# Patient Record
Sex: Male | Born: 1995
Health system: Southern US, Community
[De-identification: ages and names within clinical notes are randomized; demographics above are authoritative.]

## PROBLEM LIST (undated history)

## (undated) DIAGNOSIS — I8002 Phlebitis and thrombophlebitis of superficial vessels of left lower extremity: Principal | ICD-10-CM

## (undated) DIAGNOSIS — Z832 Family history of diseases of the blood and blood-forming organs and certain disorders involving the immune mechanism: Secondary | ICD-10-CM

## (undated) DIAGNOSIS — R2242 Localized swelling, mass and lump, left lower limb: Secondary | ICD-10-CM

## (undated) HISTORY — PX: FOOT SURGERY: SHX648

## (undated) HISTORY — DX: Family history of diseases of the blood and blood-forming organs and certain disorders involving the immune mechanism: Z83.2

## (undated) HISTORY — DX: Localized swelling, mass and lump, left lower limb: R22.42

## (undated) HISTORY — PX: GANGLION CYST EXCISION: SHX1691

## (undated) HISTORY — PX: TONSILLECTOMY: SUR1361

## (undated) HISTORY — DX: Phlebitis and thrombophlebitis of superficial vessels of left lower extremity: I80.02

---

## 2018-03-18 ENCOUNTER — Encounter (HOSPITAL_BASED_OUTPATIENT_CLINIC_OR_DEPARTMENT_OTHER): Payer: Self-pay

## 2018-03-18 ENCOUNTER — Other Ambulatory Visit: Payer: Self-pay

## 2018-03-18 ENCOUNTER — Emergency Department (HOSPITAL_BASED_OUTPATIENT_CLINIC_OR_DEPARTMENT_OTHER): Payer: Managed Care, Other (non HMO)

## 2018-03-18 ENCOUNTER — Emergency Department (HOSPITAL_BASED_OUTPATIENT_CLINIC_OR_DEPARTMENT_OTHER)
Admission: EM | Admit: 2018-03-18 | Discharge: 2018-03-19 | Disposition: A | Discharge status: Home / Self Care | Payer: Managed Care, Other (non HMO) | Attending: Emergency Medicine | Admitting: Emergency Medicine

## 2018-03-18 DIAGNOSIS — I8002 Phlebitis and thrombophlebitis of superficial vessels of left lower extremity: Secondary | ICD-10-CM | POA: Insufficient documentation

## 2018-03-18 DIAGNOSIS — R2242 Localized swelling, mass and lump, left lower limb: Secondary | ICD-10-CM | POA: Diagnosis present

## 2018-03-18 MED ORDER — IBUPROFEN 600 MG PO TABS: 600.0000 mg | ORAL_TABLET | Freq: Four times a day (QID) | ORAL | 0 refills | Status: DC | PRN

## 2018-03-18 NOTE — Discharge Instructions (Signed)
Please see the information and instructions below regarding your visit.  Your diagnoses today include:  1. Thrombophlebitis of superficial veins of left lower extremity     Tests performed today include: See side panel of your discharge paperwork for testing performed today. Vital signs are listed at the bottom of these instructions.   Your ultrasound is showing a condition called superficial thrombophlebitis.  You will need a repeat ultrasound in 1 week.  The ultrasound is available from 8 AM to 12:30 AM next Wednesday.  Medications prescribed:    Take any prescribed medications only as prescribed, and any over the counter medications only as directed on the packaging.  You are prescribed ibuprofen, a non-steroidal anti-inflammatory agent (NSAID) for pain. You may take 600 mg every 6  hours as needed for pain. If still requiring this medication around the clock for acute pain after 10 days, please see your primary healthcare provider.  Women who are pregnant, breastfeeding, or planning on becoming pregnant should not take non-steroidal anti-inflammatories such as Advil and Aleve. Tylenol is a safe over the counter pain reliever in pregnant women.  You may combine this medication with Tylenol, 650 mg every 6 hours, so you are receiving something for pain every 3 hours.  This is not a long-term medication unless under the care and direction of your primary provider. Taking this medication long-term and not under the supervision of a healthcare provider could increase the risk of stomach ulcers, kidney problems, and cardiovascular problems such as high blood pressure.   Home care instructions:  Please follow any educational materials contained in this packet.   Follow-up instructions: Please follow-up with this department in 1 week.  You will need a repeat ultrasound.  Return instructions:  Please return to the Emergency Department if you experience worsening symptoms. Please return the  emergency department if you develop any worsening swelling, redness of the leg, chest pain, shortness of breath. Please return if you have any other emergent concerns.  Additional Information:   Your vital signs today were: BP 137/88    Pulse (!) 59    Temp 98.5 F (36.9 C) (Oral)    Resp 18    Ht 5\' 8"  (1.727 m)    Wt 106 kg    SpO2 96%    BMI 35.53 kg/m  If your blood pressure (BP) was elevated on multiple readings during this visit above 130 for the top number or above 80 for the bottom number, please have this repeated by your primary care provider within one month. --------------  Thank you for allowing [place] to participate in your care today.

## 2018-03-18 NOTE — ED Triage Notes (Signed)
C/o swelling, pain to left LE x 1 week-denies injury-pt states he wears "high ankle boots at work"-sent from UC for possible DVT-pt NAD-steady gait

## 2018-03-18 NOTE — ED Notes (Signed)
ED Provider at bedside. 

## 2018-03-18 NOTE — ED Provider Notes (Signed)
[facility] Provider Note   CSN: [id number] Arrival date & time: 03/18/18  2006    History   Chief Complaint Chief Complaint  Patient presents with  . Leg Swelling    HPI Christopher Grimes is a 23 y.o. male.     HPI  Patient is a 23 year old male with no significant past medical history presenting for pain and swelling of his left lower extremity.  Reports is been progressive over 1 week.  He thought that it could have been due to wearing tight boots for work rubbing on his calf.  He reports that he presented from urgent care today and he was instructed to come to the emergency department for DVT rule out.  Patient denies any loss of sensation.  He denies any fever or chills, or erythema streaking up the leg.  He does not acknowledge a small patch of erythema in the proximal medial left calf.  He denies any history of DVT/PE, recent mobilization, hospitalization, hormone use, cancer treatment, recent surgery.  Patient does report that his father has a history of possibly unprovoked DVT.  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  He denies any chest pain or shortness of breath.  History reviewed. No pertinent past medical history.  There are no active problems to display for this patient.   Past Surgical History:  Procedure Laterality Date  . FOOT SURGERY    . GANGLION CYST EXCISION    . TONSILLECTOMY          Home Medications    Prior to Admission medications   Not on File    Family History No family history on file.  Social History Social History   Tobacco Use  . Smoking status: Never Smoker  . Smokeless tobacco: Never Used  Substance Use Topics  . Alcohol use: Never    Frequency: Never  . Drug use: Never     Allergies   Patient has no known allergies.   Review of Systems Review of Systems  Constitutional: Negative for chills and fever.  Respiratory: Negative for  shortness of breath.   Cardiovascular: Positive for leg swelling. Negative for chest pain.  Musculoskeletal: Positive for myalgias. Negative for arthralgias.  Skin: Positive for color change.  Allergic/Immunologic: Negative for immunocompromised state.  Neurological: Negative for weakness and numbness.  All other systems reviewed and are negative.    Physical Exam Updated Vital Signs BP (!) 144/93 (BP Location: Right Arm)   Pulse 81   Temp 98.5 F (36.9 C) (Oral)   Resp 16   Ht 5\' 8"  (1.727 m)   Wt 106 kg   SpO2 98%   BMI 35.53 kg/m   Physical Exam Vitals signs and nursing note reviewed.  Constitutional:      General: He is not in acute distress.    Appearance: He is well-developed.  HENT:     Head: Normocephalic and atraumatic.  Eyes:     Conjunctiva/sclera: Conjunctivae normal.     Pupils: Pupils are equal, round, and reactive to light.  Neck:     Musculoskeletal: Normal range of motion and neck supple.  Cardiovascular:     Rate and Rhythm: Normal rate and regular rhythm.     Heart sounds: S1 normal and S2 normal. No murmur.  Pulmonary:     Effort: Pulmonary effort is normal.     Breath sounds: Normal breath sounds. No wheezing or rales.  Abdominal:  General: There is no distension.     Palpations: Abdomen is soft.     Tenderness: There is no abdominal tenderness. There is no guarding.  Musculoskeletal: Normal range of motion.     Comments: Patient has some minor swelling of the mid calf of the left lower extremity.  No tenderness to palpation of the calf.  He does have an indurated area overlying the left medial calf.  Small amount of erythema overlying. He has 2+ DP pulse.  All compartments are soft.  Lymphadenopathy:     Cervical: No cervical adenopathy.  Skin:    General: Skin is warm and dry.     Findings: No erythema or rash.  Neurological:     Mental Status: He is alert.     Comments: Cranial nerves grossly intact. Patient moves extremities  symmetrically and with good coordination.  Psychiatric:        Behavior: Behavior normal.        Thought Content: Thought content normal.        Judgment: Judgment normal.      ED Treatments / Results  Labs (all labs ordered are listed, but only abnormal results are displayed) Labs Reviewed - No data to display  EKG None  Radiology US Venous Img Lower Unilateral Left  Result Date: 03/18/2018 CLINICAL DATA:  Left lower extremity (calf) swelling and erythema x1 week. EXAM: LEFT LOWER EXTREMITY VENOUS DOPPLER ULTRASOUND TECHNIQUE: Gray-scale sonography with graded compression, as well as color Doppler and duplex ultrasound were performed to evaluate the lower extremity deep venous systems from the level of the common femoral vein and including the common femoral, femoral, profunda femoral, popliteal and calf veins including the posterior tibial, peroneal and gastrocnemius veins when visible. The superficial great saphenous vein was also interrogated. Spectral Doppler was utilized to evaluate flow at rest and with distal augmentation maneuvers in the common femoral, femoral and popliteal veins. COMPARISON:  None. FINDINGS: Contralateral Common Femoral Vein: Respiratory phasicity is normal and symmetric with the symptomatic side. No evidence of thrombus. Normal compressibility. Common Femoral Vein: No evidence of thrombus. Normal compressibility, respiratory phasicity and response to augmentation. Saphenofemoral Junction: No evidence of thrombus. Normal compressibility and flow on color Doppler imaging. Profunda Femoral Vein: No evidence of thrombus. Normal compressibility and flow on color Doppler imaging. Femoral Vein: No evidence of thrombus. Normal compressibility, respiratory phasicity and response to augmentation. Popliteal Vein: No evidence of thrombus. Normal compressibility, respiratory phasicity and response to augmentation. Calf Veins: No evidence of thrombus. Normal compressibility and  flow on color Doppler imaging. Superficial Great Saphenous Vein: Thrombophlebitis of the great saphenous vein from ankle to upper calf. Venous Reflux:  None. Other Findings:  None. IMPRESSION: No evidence of deep venous thrombosis. Great saphenous vein thrombophlebitis from the level of the ankle to upper calf. These can propagate and developed into the venous thrombosis and therefore close interval follow-up or if clinically necessary, anticoagulation should be considered. Electronically Signed   By: Ashley Royalty M.D.   On: 03/18/2018 22:29    Procedures Procedures (including critical care time)  Medications Ordered in ED Medications - No data to display   Initial Impression / Assessment and Plan / ED Course  I have reviewed the triage vital signs and the nursing notes.  Pertinent labs & imaging results that were available during my care of the patient were reviewed by me and considered in my medical decision making (see chart for details).        Patient is  well-appearing and neurovascularly intact in the left lower extremity.  Patient with pain, swelling, and minor induration overlying the left medial calf.  Patient received duplex ultrasound which shows thrombophlebitis of the great saphenous vein from the level of ankle to upper calf.  No evidence of DVT.  Findings discussed with attending physician.  Patient has no other risk factors for thrombosis, but that is will need close follow-up.  Emphasized to the patient that he will need a repeat ultrasound in 1 week, and instructed him to come to the emergency department.  Do not feel that anticoagulation is warranted at this time.  Patient instructed to take NSAIDs and perform warm compresses to assist with management of this.  Return precautions given for any increase in swelling, erythema, chest pain or shortness of breath.  Patient is in understanding and agrees with the plan of care.  Final Clinical Impressions(s) / ED Diagnoses   Final  diagnoses:  Thrombophlebitis of superficial veins of left lower extremity    ED Discharge Orders         Ordered    ibuprofen (ADVIL,MOTRIN) 600 MG tablet  Every 6 hours PRN     03/18/18 2358           Tamala Julian 03/19/18 0123    Jola Schmidt, MD 03/19/18 [phone]

## 2018-03-19 ENCOUNTER — Telehealth: Payer: Self-pay

## 2018-03-19 NOTE — Telephone Encounter (Signed)
Pt had [place] at [facility] on 03/18/2018.  Scheduled a follow up US on 03/27/2018 here.  Pt notified.  Will check in through UC and follow up with UC after [place].

## 2018-03-27 ENCOUNTER — Encounter (INDEPENDENT_AMBULATORY_CARE_PROVIDER_SITE_OTHER): Payer: Self-pay

## 2018-03-27 ENCOUNTER — Emergency Department (INDEPENDENT_AMBULATORY_CARE_PROVIDER_SITE_OTHER)
Admission: EM | Admit: 2018-03-27 | Discharge: 2018-03-27 | Disposition: A | Discharge status: Home / Self Care | Payer: Managed Care, Other (non HMO) | Source: Home / Self Care | Attending: Family Medicine | Admitting: Family Medicine

## 2018-03-27 ENCOUNTER — Other Ambulatory Visit: Payer: Self-pay

## 2018-03-27 ENCOUNTER — Ambulatory Visit: Payer: Managed Care, Other (non HMO)

## 2018-03-27 DIAGNOSIS — I8002 Phlebitis and thrombophlebitis of superficial vessels of left lower extremity: Secondary | ICD-10-CM | POA: Diagnosis not present

## 2018-03-27 NOTE — ED Provider Notes (Signed)
[name]    CSN: [id number] Arrival date & time: 03/27/18  1458     History   Chief Complaint Chief Complaint  Patient presents with  . Follow-up    LT leg [place]    HPI [name] is a 23 y.o. male.   Patient presents for a repeat venous ultrasound of his left lower leg and follow-up of superficial thrombophlebitis. Nine days ago he presented to the [facility] ED for pain/swelling in his left lower extremity.  Venous ultrasound revealed superficial thrombophlebitis of greater saphenous vein from the level of the ankle to the upper calf.  He was advised to take NSAID's and apply warm compresses, and follow-up in one week for repeat venous US. Patient reports decreased leg pain and swelling.  He denies chest pain or shortness of breath. 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  He believes that his father may have had an unprovoked DVT.  The history is provided by the patient.    History reviewed. No pertinent past medical history.  There are no active problems to display for this patient.   Past Surgical History:  Procedure Laterality Date  . FOOT SURGERY    . GANGLION CYST EXCISION    . TONSILLECTOMY         Home Medications    Prior to Admission medications   Medication Sig Start Date End Date Taking? Authorizing Provider  ibuprofen (ADVIL,MOTRIN) 600 MG tablet Take 1 tablet (600 mg total) by mouth every 6 (six) hours as needed. 03/18/18   Albesa Seen, PA-C    Family History History reviewed. No pertinent family history.  Social History Social History   Tobacco Use  . Smoking status: Never Smoker  . Smokeless tobacco: Never Used  Substance Use Topics  . Alcohol use: Never    Frequency: Never  . Drug use: Never     Allergies   Patient has no known allergies.   Review of Systems Review of Systems  Constitutional: Negative for chills, diaphoresis, fatigue and fever.  HENT: Negative.   Eyes: Negative.   Respiratory:  Negative for cough, chest tightness, shortness of breath, wheezing and stridor.   Cardiovascular: Negative for chest pain, palpitations and leg swelling.  Gastrointestinal: Negative.   Genitourinary: Negative.   Musculoskeletal: Negative.   Skin: Negative.   Neurological: Negative for headaches.     Physical Exam Triage Vital Signs ED Triage Vitals  Enc Vitals Group     BP 03/27/18 1548 135/85     Pulse Rate 03/27/18 1548 64     Resp 03/27/18 1548 18     Temp --      Temp src --      SpO2 03/27/18 1548 96 %     Weight 03/27/18 1550 233 lb 11 oz (106 kg)     Height 03/27/18 1550 5\' 8"  (1.727 m)     Head Circumference --      Peak Flow --      Pain Score 03/27/18 1550 1     Pain Loc --      Pain Edu? --      Excl. in [place]? --    No data found.  Updated Vital Signs BP 135/85 (BP Location: Right Arm)   Pulse 64   Resp 18   Ht 5\' 8"  (1.727 m)   Wt 106 kg   SpO2 96%   BMI 35.53 kg/m   Visual Acuity Right Eye Distance:   Left Eye Distance:  Bilateral Distance:    Right Eye Near:   Left Eye Near:    Bilateral Near:     Physical Exam Vitals signs and nursing note reviewed.  Constitutional:      General: He is not in acute distress. HENT:     Head: Normocephalic.     Nose: Nose normal.     Mouth/Throat:     Mouth: Mucous membranes are moist.  Eyes:     Pupils: Pupils are equal, round, and reactive to light.  Cardiovascular:     Heart sounds: Normal heart sounds.  Pulmonary:     Breath sounds: Normal breath sounds. No wheezing, rhonchi or rales.  Abdominal:     Tenderness: There is no abdominal tenderness.  Musculoskeletal:     Right lower leg: No edema.     Left lower leg: He exhibits no swelling. No edema.       Legs:     Comments: Left lower leg has minimal tenderness anteriorly as noted on diagram.  No erythema or warmth.  No calf tenderness.  No tenderness above the knee.  Lymphadenopathy:     Cervical: No cervical adenopathy.  Skin:    General:  Skin is warm and dry.  Neurological:     Mental Status: He is alert and oriented to person, place, and time.      UC Treatments / Results  Labs (all labs ordered are listed, but only abnormal results are displayed) Labs Reviewed - No data to display  EKG None  Radiology US Venous Img Lower Unilateral Left  Result Date: 03/27/2018 CLINICAL DATA:  Follow-up of recent thrombophlebitis of the left great saphenous vein. EXAM: LEFT LOWER EXTREMITY VENOUS DOPPLER ULTRASOUND TECHNIQUE: Gray-scale sonography with graded compression, as well as color Doppler and duplex ultrasound were performed to evaluate the lower extremity deep venous systems from the level of the common femoral vein and including the common femoral, femoral, profunda femoral, popliteal and calf veins including the posterior tibial, peroneal and gastrocnemius veins when visible. The superficial great saphenous vein was also interrogated. Spectral Doppler was utilized to evaluate flow at rest and with distal augmentation maneuvers in the common femoral, femoral and popliteal veins. COMPARISON:  03/18/2018 FINDINGS: Contralateral Common Femoral Vein: Respiratory phasicity is normal and symmetric with the symptomatic side. No evidence of thrombus. Normal compressibility. Common Femoral Vein: No evidence of thrombus. Normal compressibility, respiratory phasicity and response to augmentation. Saphenofemoral Junction: No evidence of thrombus. Normal compressibility and flow on color Doppler imaging. Profunda Femoral Vein: No evidence of thrombus. Normal compressibility and flow on color Doppler imaging. Femoral Vein: No evidence of thrombus. Normal compressibility, respiratory phasicity and response to augmentation. Popliteal Vein: No evidence of thrombus. Normal compressibility, respiratory phasicity and response to augmentation. Calf Veins: No evidence of thrombus. Normal compressibility and flow on color Doppler imaging. Superficial Great  Saphenous Vein: There remains evidence of superficial thrombophlebitis in the left great saphenous vein extending from just above the knee to the ankle. Wall there may be slight propagation of GSV thrombus to just above the knee, there certainly is no evidence of thrombus further in the thigh or at the saphenofemoral junction. Venous Reflux:  None. Other Findings:  No abnormal fluid collections. IMPRESSION: Persistent left GSV superficial thrombophlebitis. It is likely too early to detect any significant resolution. While there may be slightly higher thrombus in the GSV to just above the knee, there clearly is no evidence of deep venous thrombosis. Electronically Signed   By: Eulas Post  Kathlene Cote M.D.   On: 03/27/2018 15:03    Procedures Procedures (including critical care time)  Medications Ordered in UC Medications - No data to display  Initial Impression / Assessment and Plan / UC Course  I have reviewed the triage vital signs and the nursing notes.  Pertinent labs & imaging results that were available during my care of the patient were reviewed by me and considered in my medical decision making (see chart for details).    Superficial thrombophlebitis of left greater saphenous vein does not appear to have progressed.   Recommend that he follow-up with PCP in about two weeks. Patient may have factor V Leiden deficiency (he believes that his father had a blood clotting disorder).   Final Clinical Impressions(s) / UC Diagnoses   Final diagnoses:  Thrombophlebitis of superficial veins of left lower extremity     Discharge Instructions     Continue Ibuprofen 200mg , 4 tabs every 8 hours with food.  Elevate legs and apply heating pad whenever possible. Recommend obtaining graduated compression stockings to wear daytime.  If symptoms become significantly worse during the night or over the weekend, proceed to the local emergency room.     ED Prescriptions    None        Kandra Nicolas, MD 04/07/18 [phone]

## 2018-03-27 NOTE — ED Triage Notes (Signed)
Pt here today for follow up  Of LT leg pain and swelling. Had [place] at [place] on 2/19/ Had repeat US and here for results.

## 2018-03-27 NOTE — Discharge Instructions (Addendum)
Continue Ibuprofen 200mg , 4 tabs every 8 hours with food.  Elevate legs and apply heating pad whenever possible. Recommend obtaining graduated compression stockings to wear daytime.  If symptoms become significantly worse during the night or over the weekend, proceed to the local emergency room.

## 2018-04-02 ENCOUNTER — Other Ambulatory Visit: Payer: Self-pay

## 2018-04-02 ENCOUNTER — Emergency Department: Payer: Managed Care, Other (non HMO)

## 2018-04-02 ENCOUNTER — Emergency Department (INDEPENDENT_AMBULATORY_CARE_PROVIDER_SITE_OTHER)
Admission: EM | Admit: 2018-04-02 | Discharge: 2018-04-02 | Disposition: A | Discharge status: Home / Self Care | Payer: Managed Care, Other (non HMO) | Source: Home / Self Care | Attending: Emergency Medicine | Admitting: Emergency Medicine

## 2018-04-02 ENCOUNTER — Encounter: Payer: Self-pay | Admitting: Emergency Medicine

## 2018-04-02 DIAGNOSIS — I82409 Acute embolism and thrombosis of unspecified deep veins of unspecified lower extremity: Secondary | ICD-10-CM

## 2018-04-02 DIAGNOSIS — I8002 Phlebitis and thrombophlebitis of superficial vessels of left lower extremity: Secondary | ICD-10-CM

## 2018-04-02 MED ORDER — AMOXICILLIN-POT CLAVULANATE 875-125 MG PO TABS: 1.0000 | ORAL_TABLET | Freq: Two times a day (BID) | ORAL | 0 refills | Status: DC

## 2018-04-02 MED ORDER — RIVAROXABAN (XARELTO) VTE STARTER PACK (15 & 20 MG): ORAL_TABLET | ORAL | 0 refills | Status: DC

## 2018-04-02 NOTE — Discharge Instructions (Addendum)
Take antibiotics as instructed. Continue to wear support hose. Please review with the pharmacist the risks of taking a blood thinner. Please keep your follow-up appointment to have testing done for predisposition to clots. Stop your ibuprofen since you are on Xarelto If you develop chest pain or shortness of breath please go immediately to the emergency room.

## 2018-04-02 NOTE — ED Triage Notes (Signed)
Left leg pain, thrombophebitis, getting worse and moving up above his knee.

## 2018-04-02 NOTE — ED Provider Notes (Signed)
[facility]    CSN: [id number] Arrival date & time: 04/02/18  0820     History   Chief Complaint Chief Complaint  Patient presents with  . Leg Pain    HPI Christopher Grimes is a 23 y.o. male.   HPI Patient enters for follow-up of increasing discomfort in his left leg.  Patient was seen initially 03/18/2018 in the emergency room.  He had ultrasound done at that time which showed thrombophlebitis involving the great saphenous vein from the ankle to the upper calf of his left leg.  He was started on ibuprofen and seen in follow-up on 03/27/2018.  Ultrasound at that time showed progression of the clot to just above the knee but no evidence of DVT.  At that time he was changed to ibuprofen 803 times a day.  He has been wearing a support stocking on that leg.  At the end of the day he has significant redness involving that vein.  The area has become tender where previously it was had minimal tenderness.  He has also noted a knot-like area present over the lower medial quadriceps area.  Of note his father is a Leiden factor V mutation positive as is one brother.  He has never had testing. History reviewed. No pertinent past medical history.  There are no active problems to display for this patient.   Past Surgical History:  Procedure Laterality Date  . FOOT SURGERY    . GANGLION CYST EXCISION    . TONSILLECTOMY         Home Medications    Prior to Admission medications   Medication Sig Start Date End Date Taking? Authorizing Provider  amoxicillin-clavulanate (AUGMENTIN) 875-125 MG tablet Take 1 tablet by mouth 2 (two) times daily. 04/02/18   Darlyne Russian, MD  ibuprofen (ADVIL,MOTRIN) 600 MG tablet Take 1 tablet (600 mg total) by mouth every 6 (six) hours as needed. 03/18/18   Albesa Seen, PA-C  Rivaroxaban 15 & 20 MG TBPK Take as directed on package: Start with one 15mg  tablet by mouth twice a day with food. On Day 22, switch to one 20mg  tablet once a day with food. 04/02/18    Darlyne Russian, MD    Family History History reviewed. No pertinent family history.  Social History Social History   Tobacco Use  . Smoking status: Never Smoker  . Smokeless tobacco: Never Used  Substance Use Topics  . Alcohol use: Never    Frequency: Never  . Drug use: Never     Allergies   Patient has no known allergies.   Review of Systems Review of Systems  Constitutional: Negative.   Respiratory: Negative.   Cardiovascular: Negative.   Musculoskeletal:       There is a lipomatous area present lower left medial quad.  There is a cordlike structure present from just above the medial malleolus which extends up the medial calf on the left to 6 inches above the knee.  This area is cordlike and tender to touch.  There is redness along this area.     Physical Exam Triage Vital Signs ED Triage Vitals  Enc Vitals Group     BP 04/02/18 0839 (!) 149/96     Pulse Rate 04/02/18 0839 64     Resp --      Temp 04/02/18 0839 98.4 F (36.9 C)     Temp Source 04/02/18 0839 Oral     SpO2 04/02/18 0839 99 %  Weight 04/02/18 0842 210 lb (95.3 kg)     Height 04/02/18 0842 5\' 8"  (1.727 m)     Head Circumference --      Peak Flow --      Pain Score 04/02/18 0842 6     Pain Loc --      Pain Edu? --      Excl. in [place]? --    No data found.  Updated Vital Signs BP 130/80 (BP Location: Right Arm)   Pulse 64   Temp 98.4 F (36.9 C) (Oral)   Ht 5\' 8"  (1.727 m)   Wt 95.3 kg   SpO2 99%   BMI 31.93 kg/m   Visual Acuity Right Eye Distance:   Left Eye Distance:   Bilateral Distance:    Right Eye Near:   Left Eye Near:    Bilateral Near:     Physical Exam   UC Treatments / Results  Labs (all labs ordered are listed, but only abnormal results are displayed) Labs Reviewed - No data to display  EKG None  Radiology US Venous Img Lower Unilateral Left  Result Date: 04/02/2018 CLINICAL DATA:  Left GSV thrombophlebitis EXAM: LEFT LOWER EXTREMITY VENOUS DOPPLER  ULTRASOUND TECHNIQUE: Gray-scale sonography with graded compression, as well as color Doppler and duplex ultrasound were performed to evaluate the lower extremity deep venous systems from the level of the common femoral vein and including the common femoral, femoral, profunda femoral, popliteal and calf veins including the posterior tibial, peroneal and gastrocnemius veins when visible. The superficial great saphenous vein was also interrogated. Spectral Doppler was utilized to evaluate flow at rest and with distal augmentation maneuvers in the common femoral, femoral and popliteal veins. COMPARISON:  None. FINDINGS: Contralateral Common Femoral Vein: Respiratory phasicity is normal and symmetric with the symptomatic side. No evidence of thrombus. Normal compressibility. Common Femoral Vein: No evidence of thrombus. Normal compressibility, respiratory phasicity and response to augmentation. Saphenofemoral Junction: No evidence of thrombus. Normal compressibility and flow on color Doppler imaging. Profunda Femoral Vein: No evidence of thrombus. Normal compressibility and flow on color Doppler imaging. Femoral Vein: No evidence of thrombus. Normal compressibility, respiratory phasicity and response to augmentation. Popliteal Vein: No evidence of thrombus. Normal compressibility, respiratory phasicity and response to augmentation. Calf Veins: No evidence of thrombus. Normal compressibility and flow on color Doppler imaging. Superficial Great Saphenous Vein: Similar diffuse superficial thrombosis of the left GSV from the mid thigh extending inferiorly into the calf. Left GSV is noncompressible. GSV superficial thrombosis is occlusive. Venous Reflux:  Not assessed Other Findings:  None. IMPRESSION: Diffuse left GSV superficial thrombosis/thrombophlebitis, unchanged. Negative for left lower extremity DVT. Electronically Signed   By: Jerilynn Mages.  Shick M.D.   On: 04/02/2018 11:07    Procedures Procedures (including critical  care time)  Medications Ordered in UC Medications - No data to display  Initial Impression / Assessment and Plan / UC Course  I have reviewed the triage vital signs and the nursing notes.  Pertinent labs & imaging results that were available during my care of the patient were reviewed by me and considered in my medical decision making (see chart for details). Patient has had progressive worsening of his known superficial phlebitis of the left greater saphenous vein.  Will contact vein and vascular in [facility] to see if we can get him an early appointment. I was able to discuss the situation with Dr. Bridgett Larsson with vein and vascular.  Due to clinical  progression of his clot despite  anti-inflammatories and his recent development of pain and redness over the area of phlebitis it was recommended he receive 5 days of Augmentin as well as initiation of Xarelto therapy.  We will stop his ibuprofen for now and he will apply warm compresses to his leg.  He was given a note to be out of work until Monday he was advised of the risks of bleeding with the Xarelto.  He was also advised to follow-up with primary care to monitor Xarelto therapy.  Ultrasound was done today and did not show any signs of progression of the thrombus.  There was diffuse left greater saphenous vein superficial thrombosis thrombophlebitis.     Final Clinical Impressions(s) / UC Diagnoses   Final diagnoses:  Thrombophlebitis of superficial veins of left lower extremity     Discharge Instructions     Take antibiotics as instructed. Continue to wear support hose. Please review with the pharmacist the risks of taking a blood thinner. Please keep your follow-up appointment to have testing done for predisposition to clots. Stop your ibuprofen since you are on Xarelto If you develop chest pain or shortness of breath please go immediately to the emergency room.    ED Prescriptions    Medication Sig Dispense Auth. Provider    Rivaroxaban 15 & 20 MG TBPK Take as directed on package: Start with one 15mg  tablet by mouth twice a day with food. On Day 22, switch to one 20mg  tablet once a day with food. 51 each Darlyne Russian, MD   amoxicillin-clavulanate (AUGMENTIN) 875-125 MG tablet Take 1 tablet by mouth 2 (two) times daily. 10 tablet Darlyne Russian, MD     Controlled Substance Prescriptions [place] Controlled Substance Registry consulted? Not Applicable   Darlyne Russian, MD 04/02/18 1123

## 2018-04-15 ENCOUNTER — Telehealth: Payer: Self-pay | Admitting: General Practice

## 2018-04-15 NOTE — Telephone Encounter (Signed)
Left voicemail for patient to call us back to see if he can come in sooner due to everything going on for his visit.

## 2018-04-15 NOTE — Telephone Encounter (Signed)
-----   Message from [name], DO sent at 04/15/2018  2:36 PM EDT ----- Regarding: rescheudle Can move patient to 7:30 or 10:30 tomorrow - trying to keep well-visits in the AM and sick visits in PM  Thanks!

## 2018-04-16 ENCOUNTER — Encounter: Payer: Self-pay | Admitting: Osteopathic Medicine

## 2018-04-16 ENCOUNTER — Ambulatory Visit: Payer: Managed Care, Other (non HMO) | Admitting: Osteopathic Medicine

## 2018-04-16 ENCOUNTER — Other Ambulatory Visit: Payer: Self-pay

## 2018-04-16 ENCOUNTER — Ambulatory Visit (INDEPENDENT_AMBULATORY_CARE_PROVIDER_SITE_OTHER): Payer: Managed Care, Other (non HMO) | Admitting: Osteopathic Medicine

## 2018-04-16 ENCOUNTER — Ambulatory Visit (INDEPENDENT_AMBULATORY_CARE_PROVIDER_SITE_OTHER): Payer: Managed Care, Other (non HMO)

## 2018-04-16 VITALS — BP 129/67 | HR 46 | Temp 98.0°F | Ht 68.0 in | Wt 234.6 lb

## 2018-04-16 DIAGNOSIS — R2242 Localized swelling, mass and lump, left lower limb: Secondary | ICD-10-CM | POA: Diagnosis not present

## 2018-04-16 DIAGNOSIS — I8002 Phlebitis and thrombophlebitis of superficial vessels of left lower extremity: Secondary | ICD-10-CM

## 2018-04-16 DIAGNOSIS — Z832 Family history of diseases of the blood and blood-forming organs and certain disorders involving the immune mechanism: Secondary | ICD-10-CM

## 2018-04-16 HISTORY — DX: Localized swelling, mass and lump, left lower limb: R22.42

## 2018-04-16 HISTORY — DX: Family history of diseases of the blood and blood-forming organs and certain disorders involving the immune mechanism: Z83.2

## 2018-04-16 HISTORY — DX: Phlebitis and thrombophlebitis of superficial vessels of left lower extremity: I80.02

## 2018-04-16 MED ORDER — RIVAROXABAN 20 MG PO TABS: 20.0000 mg | ORAL_TABLET | Freq: Every day | ORAL | 0 refills | Status: DC

## 2018-04-16 NOTE — Patient Instructions (Signed)
Plan: Follow-up based on lab results and Xray results - we should have these back by next week! In the meantime continue Xarelto, switching to 20 mg after 21 days on the 15 mg.  Any questions or concerns, let me know!

## 2018-04-16 NOTE — Progress Notes (Signed)
HPI: Christopher Grimes is a 23 y.o. male who  has a past medical history of Family history of bleeding or clotting disorder (04/16/2018), Mass of left thigh (04/16/2018), and Thrombophlebitis of superficial veins of left lower extremity (04/16/2018).  he presents to [facility] today, 04/16/18,  for chief complaint of: New to establish Following up thrombophlebitis   Recently seen a few times in our urgent care for thrombophlebitis.  Clot was progressing despite standard of care treatment so patient was started on anticoagulation with Xarelto.  He is taking this without any problems, no increased bleeding.  The superficial thrombophlebitis symptoms are about the same.  Has some concerns as he has father with factor V Leiden deficiency.  Patient requests testing for hypercoagulability.  No history of DVT.  He also has on left quadriceps a mass that he noticed within the past couple of weeks with all this going on.  He thinks that it was not present before.  He does have a history of lipoma removal from his left chest wall.  The mass is nonpainful  Past medical, surgical, social and family history reviewed:  Patient Active Problem List   Diagnosis Date Noted  . Thrombophlebitis of superficial veins of left lower extremity 04/16/2018  . Family history of bleeding or clotting disorder 04/16/2018  . Mass of left thigh 04/16/2018    Past Surgical History:  Procedure Laterality Date  . FOOT SURGERY    . GANGLION CYST EXCISION    . TONSILLECTOMY      Social History   Tobacco Use  . Smoking status: Never Smoker  . Smokeless tobacco: Never Used  Substance Use Topics  . Alcohol use: Never    Frequency: Never    Family History  Problem Relation Age of Onset  . Factor V Leiden deficiency Father      Current medication list and allergy/intolerance information reviewed:    Current Outpatient Medications  Medication Sig Dispense Refill  . ibuprofen  (ADVIL,MOTRIN) 600 MG tablet Take 1 tablet (600 mg total) by mouth every 6 (six) hours as needed. 20 tablet 0  . Rivaroxaban 15 & 20 MG TBPK Take as directed on package: Start with one 15mg  tablet by mouth twice a day with food. On Day 22, switch to one 20mg  tablet once a day with food. 51 each 0  .   70 tablet 0   No current facility-administered medications for this visit.     No Known Allergies    Review of Systems:  Constitutional:  No  fever, no chills, No recent illness, No unintentional weight changes. No significant fatigue.   HEENT: No  headache, no vision change, no hearing change, No sore throat, No  sinus pressure  Cardiac: No  chest pain, No  pressure, No palpitations, No  Orthopnea  Respiratory:  No  shortness of breath. No  Cough  Gastrointestinal: No  abdominal pain, No  nausea, No  vomiting,  No  blood in stool, No  diarrhea, No  constipation   Musculoskeletal: No new myalgia/arthralgia  Skin: No  Rash, No other wounds/concerning lesions  Genitourinary: No  incontinence, No  abnormal genital bleeding, No abnormal genital discharge  Hem/Onc: No  easy bruising/bleeding, No  abnormal lymph node  Endocrine: No cold intolerance,  No heat intolerance. No polyuria/polydipsia/polyphagia   Neurologic: No  weakness, No  dizziness, No  slurred speech/focal weakness/facial droop  Psychiatric: No  concerns with depression, No  concerns with anxiety, No sleep problems,  No mood problems  Exam:  BP 129/67 (BP Location: Left Arm, Patient Position: Sitting, Cuff Size: Normal)   Pulse (!) 46   Temp 98 F (36.7 C) (Oral)   Ht 5\' 8"  (1.727 m)   Wt 234 lb 9.6 oz (106.4 kg)   BMI 35.67 kg/m   Constitutional: VS see above. General Appearance: alert, well-developed, well-nourished, NAD  Eyes: Normal lids and conjunctive, non-icteric sclera  Ears, Nose, Mouth, Throat: MMM, Normal external inspection ears/nares/mouth/lips/gums.   Neck: No masses, trachea midline. No thyroid  enlargement. No tenderness/mass appreciated. No lymphadenopathy  Respiratory: Normal respiratory effort. no wheeze, no rhonchi, no rales  Cardiovascular: S1/S2 normal, no murmur, no rub/gallop auscultated. RRR. No lower extremity edema  Gastrointestinal: Nontender, no masses.   Musculoskeletal: Gait normal. No clubbing/cyanosis of digits.  Approximately 3 cm diameter mass in left quadriceps muscle, nontender, no overlying skin changes.  Neurological: Normal balance/coordination. No tremor. No cranial nerve deficit on limited exam. Motor and sensation intact and symmetric. Cerebellar reflexes intact.   Skin: warm, dry, intact. No rash/ulcer. No concerning nevi or subq nodules on limited exam.    Psychiatric: Normal judgment/insight. Normal mood and affect. Oriented x3.      Dg Femur Min 2 Views Left  Result Date: 04/16/2018 CLINICAL DATA:  Bump at mid LEFT thigh for 2 weeks, question muscle mass EXAM: LEFT FEMUR 2 VIEWS COMPARISON:  None FINDINGS: Osseous mineralization normal. Joint spaces preserved. No acute fracture, dislocation, or bone destruction. Fat attenuation mass identified at the medial mid LEFT thigh, measuring 11.0 x 4.4 x >3.6 cm AP. This may represent an intramuscular lipoma or a liposarcoma. No additional soft tissue abnormalities. 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 Further characterization of this lesion by MR imaging with and without contrast recommended. These results will be called to the ordering clinician or representative by the Radiologist Assistant, and communication documented in the PACS or zVision Dashboard. Electronically Signed   By: Lavonia Dana M.D.   On: 04/16/2018 08:45     ASSESSMENT/PLAN: The primary encounter diagnosis was Thrombophlebitis of superficial veins of left lower extremity. Diagnoses of Family history of  bleeding or clotting disorder and Mass of left thigh were also pertinent to this visit.   Orders Placed This Encounter  Procedures  . DG FEMUR MIN 2 VIEWS LEFT  . MR FEMUR LEFT W WO CONTRAST  . CBC  . COMPLETE METABOLIC PANEL WITH GFR  . Lipid panel  . TSH  . Factor 5 leiden  . Prothrombin gene mutation  . INR/PT  . Antithrombin III  . Protein C activity  . Protein S activity  . Antiphospholopid Ab Panel    Meds ordered this encounter  Medications  . rivaroxaban (XARELTO) 20 MG TABS tablet    Sig: Take 1 tablet (20 mg total) by mouth daily with supper.    Dispense:  70 tablet    Refill:  0    Patient Instructions  Plan: Follow-up based on lab results and Xray results - we should have these back by next week! In the meantime continue Xarelto, switching to 20 mg after 21 days on the 15 mg.  Any questions or concerns, let me know!         Visit summary with medication list and pertinent instructions was printed for patient to review. All questions at time of visit were answered -  patient instructed to contact office with any additional concerns or updates. ER/RTC precautions were reviewed with the patient.      Please note: voice recognition software was used to produce this document, and typos may escape review. Please contact Dr. Sheppard Coil for any needed clarifications.     Follow-up plan: Return for recheck depending on results .

## 2018-04-20 LAB — ANTIPHOSPHOLOPID AB PANEL
Anticardiolipin IgA: <11 [APL'U] (ref <=11)
Anticardiolipin IgG: <14 [GPL'U] (ref <=14)
Anticardiolipin IgM: <12 [MPL'U] (ref <=12)
Beta-2 Glyco 1 IgA: <9 SAU (ref <=20)
Beta-2 Glyco 1 IgM: <9 SMU (ref <=20)
Beta-2 Glyco I IgG: <9 SGU (ref <=20)
PHOSPHATIDYLSERINE AB  (IGG): <10 U/mL (ref <10)
PHOSPHATIDYLSERINE AB  (IGM): <25 U/mL (ref <25)
PHOSPHATIDYLSERINE AB (IGA): <20 U/mL (ref <20)

## 2018-04-20 LAB — PROTIME-INR
INR: 1.2 — ABNORMAL HIGH
Prothrombin Time: 12.2 s — ABNORMAL HIGH (ref 9.0–11.5)

## 2018-04-20 LAB — CBC
HCT: 45.8 % (ref 38.5–50.0)
Hemoglobin: 15.9 g/dL (ref 13.2–17.1)
MCH: 29.8 pg (ref 27.0–33.0)
MCHC: 34.7 g/dL (ref 32.0–36.0)
MCV: 85.8 fL (ref 80.0–100.0)
MPV: 10.6 fL (ref 7.5–12.5)
Platelets: 230 10*3/uL (ref 140–400)
RBC: 5.34 10*6/uL (ref 4.20–5.80)
RDW: 12.6 % (ref 11.0–15.0)
WBC: 4.4 10*3/uL (ref 3.8–10.8)

## 2018-04-20 LAB — PROTHROMBIN GENE MUTATION

## 2018-04-20 LAB — LIPID PANEL
CHOLESTEROL: 184 mg/dL (ref <200)
HDL: 45 mg/dL (ref >=40)
LDL Cholesterol (Calc): 125 mg/dL (calc) — ABNORMAL HIGH
Non-HDL Cholesterol (Calc): 139 mg/dL (calc) — ABNORMAL HIGH (ref <130)
Total CHOL/HDL Ratio: 4.1 (calc) (ref <5.0)
Triglycerides: 57 mg/dL (ref <150)

## 2018-04-20 LAB — TSH: TSH: 1.06 mIU/L (ref 0.40–4.50)

## 2018-04-20 LAB — COMPLETE METABOLIC PANEL WITH GFR
AG Ratio: 3 (calc) — ABNORMAL HIGH (ref 1.0–2.5)
ALBUMIN MSPROF: 4.8 g/dL (ref 3.6–5.1)
ALT: 16 U/L (ref 9–46)
AST: 14 U/L (ref 10–40)
Alkaline phosphatase (APISO): 18 U/L — ABNORMAL LOW (ref 36–130)
BUN: 12 mg/dL (ref 7–25)
CO2: 28 mmol/L (ref 20–32)
Calcium: 9.2 mg/dL (ref 8.6–10.3)
Chloride: 104 mmol/L (ref 98–110)
Creat: 1.18 mg/dL (ref 0.60–1.35)
GFR, Est African American: 100 mL/min/{1.73_m2} (ref >=60)
GFR, Est Non African American: 86 mL/min/{1.73_m2} (ref >=60)
Globulin: 1.6 g/dL (calc) — ABNORMAL LOW (ref 1.9–3.7)
Glucose, Bld: 92 mg/dL (ref 65–139)
Potassium: 4.4 mmol/L (ref 3.5–5.3)
Sodium: 140 mmol/L (ref 135–146)
Total Bilirubin: 0.8 mg/dL (ref 0.2–1.2)
Total Protein: 6.4 g/dL (ref 6.1–8.1)

## 2018-04-20 LAB — FACTOR 5 LEIDEN

## 2018-04-20 LAB — PROTEIN C ACTIVITY: Protein C Activity: 159 % (ref 70–180)

## 2018-04-20 LAB — ANTITHROMBIN III: AntiThromb III Func: 115 % activity (ref 80–120)

## 2018-04-20 LAB — PROTEIN S ACTIVITY: PROTEIN S ACTIVITY: 171 % — AB (ref 70–150)

## 2018-04-21 ENCOUNTER — Telehealth: Payer: Self-pay | Admitting: Osteopathic Medicine

## 2018-04-21 NOTE — Telephone Encounter (Signed)
MRI approved. Is this scan OK to wait or should it be moved up sooner? I can update the order to ASAP instead of routine. Routing.

## 2018-04-22 NOTE — Telephone Encounter (Signed)
Imaging notified  

## 2018-04-22 NOTE — Telephone Encounter (Signed)
Not urgent, can schedule whenever!

## 2018-04-24 ENCOUNTER — Telehealth: Payer: Self-pay | Admitting: Osteopathic Medicine

## 2018-04-24 DIAGNOSIS — D6851 Activated protein C resistance: Secondary | ICD-10-CM

## 2018-04-24 NOTE — Telephone Encounter (Signed)
Pt advised of lab results. Would like to proceed with referral to hematology. Pended for PCP

## 2018-04-27 NOTE — Telephone Encounter (Signed)
Left VM with update.  

## 2018-04-27 NOTE — Telephone Encounter (Signed)
Order signed, thanks!  Please call patient and let him know he should be hearing back form hematology. Will get a call from the cancer canter, don't be alarmed about this, hematology & oncology is the same specialty.

## 2018-04-29 ENCOUNTER — Telehealth: Payer: Self-pay | Admitting: Internal Medicine

## 2018-04-29 NOTE — Telephone Encounter (Signed)
A new hem appt has been scheduled for the pt to see Dr. Walden Field on 3/23 at 10:50am. Pt has agreed to the appt date and time.

## 2018-05-19 ENCOUNTER — Telehealth: Payer: Self-pay

## 2018-05-19 NOTE — Addendum Note (Signed)
Addended by: Alena Bills R on: 05/19/2018 02:25 PM   Modules accepted: Orders

## 2018-05-19 NOTE — Progress Notes (Signed)
MRI updated per Provider request

## 2018-05-19 NOTE — Telephone Encounter (Signed)
Old order cancelled, reordered as ASAP. Imaging notified.

## 2018-05-19 NOTE — Telephone Encounter (Signed)
Not urgent in the sense of immediate life or death, but urgent in that we do need to further evaluate this mass to confirm it's benign. Can we call downstairs and see if we can get him scheduled in the next couple weeks? If they need me to change the order, I can do that.

## 2018-05-19 NOTE — Telephone Encounter (Signed)
All routine orders are to be scheduled in June. Per last update, he was OK to wait. Do we need to change that?

## 2018-05-19 NOTE — Telephone Encounter (Signed)
Pt left a vm msg stating he contacted Imaging regarding MRI. Pt was informed by Imaging, that MRI is not urgent as per provider and has been postponed. Pt wants to confirm current information with provider. Can pt hold off of MRI for now? Pls advise, thanks.

## 2018-05-19 NOTE — Telephone Encounter (Signed)
I think should be sooner  Need to rule out liposarcoma mass in thigh Let me know if I need to update the order!

## 2018-05-20 NOTE — Telephone Encounter (Signed)
Called pt, aware of update regarding MRI referral. As per pt, has an upcoming appt scheduled on 05/25/18. No other inquiries during call.

## 2018-05-21 ENCOUNTER — Other Ambulatory Visit: Payer: Self-pay

## 2018-05-21 ENCOUNTER — Inpatient Hospital Stay: Payer: Managed Care, Other (non HMO) | Attending: Internal Medicine | Admitting: Internal Medicine

## 2018-05-21 ENCOUNTER — Encounter: Payer: Self-pay | Admitting: Internal Medicine

## 2018-05-21 VITALS — BP 130/74 | HR 47 | Temp 97.9°F | Resp 18 | Wt 228.4 lb

## 2018-05-21 DIAGNOSIS — Z791 Long term (current) use of non-steroidal anti-inflammatories (NSAID): Secondary | ICD-10-CM

## 2018-05-21 DIAGNOSIS — Z7901 Long term (current) use of anticoagulants: Secondary | ICD-10-CM

## 2018-05-21 DIAGNOSIS — R2242 Localized swelling, mass and lump, left lower limb: Secondary | ICD-10-CM | POA: Diagnosis not present

## 2018-05-21 DIAGNOSIS — D6851 Activated protein C resistance: Secondary | ICD-10-CM | POA: Diagnosis not present

## 2018-05-21 DIAGNOSIS — I8002 Phlebitis and thrombophlebitis of superficial vessels of left lower extremity: Secondary | ICD-10-CM

## 2018-05-21 DIAGNOSIS — Z832 Family history of diseases of the blood and blood-forming organs and certain disorders involving the immune mechanism: Secondary | ICD-10-CM

## 2018-05-21 NOTE — Progress Notes (Signed)
Referring Physician:  [name], DO  Diagnosis No diagnosis found.  Staging Cancer Staging No matching staging information was found for the patient.  Assessment and Plan:  1.  Superficial thrombophlebitis.  23 year old Grimes referred for evaluation due to being heterozygous for factor V Leiden.  Pt has been seen several times in urgent care for thrombophlebitis.  Symptoms failed to improve so pt was started on anticoagulation with Xarelto.  He feels leg has improved.  Pt denies any history of DVT or PE.  He denies any bleeding or bruising.  Pt had left LE doppler done 03/18/2018 that showed  IMPRESSION: No evidence of deep venous thrombosis. Great saphenous vein thrombophlebitis from the level of the ankle to upper calf. These can propagate and developed into the venous thrombosis and therefore close interval follow-up or if clinically necessary, anticoagulation should be considered.  Pt reports he was taking NSAIDS and failed to note improvement.  He had repeat imaging done 03/27/2018 that showed  IMPRESSION: Persistent left GSV superficial thrombophlebitis. It is likely too early to detect any significant resolution. While there may be slightly higher thrombus in the GSV to just above the knee, there clearly is no evidence of deep venous thrombosis.  Pt has repeat imaging done 04/02/2018 that showed  IMPRESSION: Diffuse left GSV superficial thrombosis/thrombophlebitis, unchanged.  Negative for left lower extremity DVT.  He has undergone evaluation for a  left quadriceps a mass that he noticed within the past couple of weeks.  He is unsure if it was present before.  He reports he had a lipoma removed from left chest wall in the past.  He denies the area is painful.  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  Further characterization of this lesion by MR imaging with and without contrast recommended.  Pt has family history of father with clots and is on coumadin.  Father reportedly has Factor V Leiden.  Pt requested testing.  He denies smoking.  He thinks that it was not present before.  He does have a history of lipoma removal from his left chest wall.  The mass is nonpainful.  He is scheduled for MRI of area reportedly on Monday 05/25/2018.  Pt is wondering when he can come off anticoagulation.  He is seen today for consultation due to Factor V leiden and superficial thrombophlebitis.    I discussed with pt that usually superficial thrombosis is treated with supportive therapy and antiinflammatory agents.  Pt has undergone 3 doppler evaluations that show no evidence of DVT.  He has reportedly been on Xarelto since February.  He feels symptoms have improved.  It is reasonable after he has been on anticoagulation for more than 6 weeks he can come off therapy.  I discussed with him he is heterozygous for factor V Leiden which does put him had higher risk for thrombosis.  I have also discussed with him he will need evaluation of the reported mass in left thigh if confirmed on MRI.  Pt is advised to notify the office if any change in symptoms or SOB once he has completed Xarelto.  Pt is given June 2020  follow-up pending MRI results.    2.  Heterozygous for factor V Leiden.  Pt requested testing with PCP and labs done 04/16/2018 reviewed and showed WBC  4.4 HB 15.9 plts 230,000.  Chemistries WNL with K+ 4.4 Cr 1.18 and norma LFTs. Pt has normal Beta 2 Glycoprotein, ACL ab, AT III, PROTEIN C and S and Prothrombin gene mutation.    Pt underwent Factor V Leiden testing and results showed  Impression: This individual is heterozygous for the  Factor V Leiden (R506Q) variant in the Factor V gene.  Heterozygotes have a 3-8-fold increased risk for venous  Thrombosis.  Pt has family history of  father with clots and is on coumadin.  Father reportedly has Factor V Leiden.  Pt requested testing.  He denies smoking.  He thinks that it was not present before.  He does have a history of lipoma removal from his left chest wall.  The mass is nonpainful.  He is scheduled for MRI of area reportedly on Monday 05/25/2018.  Pt is wondering when he can come off anticoagulation.  He is seen today for consultation due to Factor V leiden and superficial thrombophlebitis.    I discussed with pt that usually superficial thrombosis are treated with supportive therapy and antiinflammatory agents.  Pt has undergone 3 doppler evaluations that show no evidence of DVT.  He has reportedly been on Xarelto since February.  He feels symptoms have improved.  It is reasonable after he has been on anticoagulation for more than 6 weeks he can come off therapy.  I discussed with him he is heterozygous for factor V Leiden which does put him had higher risk for thrombosis.   I have also discussed with him he will need evaluation of the reported mass in left thigh if confirmed on MRI.  Pt is advised to notify the office if any change in symptoms or SOB once he has completed Xarelto.    3.  Left thigh mass. Pt reports in left quadriceps a mass that he noticed within the past couple of weeks.  He is unsure if it was present before.  He reports he had a lipoma removed from left chest wall in the past.  He denies the area is painful.    Pt has xray of left femur done 04/16/2018 that showed  IMPRESSION: 11.0 x 4.4 x >3.6 cm fat attenuation mass at medial LEFT thigh, centered at the medial thigh muscle planes, may represent an intramuscular lipoma or liposarcoma.  Further characterization of this lesion by MR imaging with and without contrast recommended.  He reportedly is scheduled for MRI evaluation on Monday.  Will obtain results for review and I discussed with pt he will be recommended for orthopedic oncology evaluation.  If  surgery recommended pt will be recommended for prophylactic anticoagulation.    40 minutes spent with more than 50% spent in review of records, counseling and coordination of care.    HPI:  23 year old Grimes referred for evaluation due to being heterozygous for factor V Leiden.  Pt has been seen several times in urgent care for thrombophlebitis.  Symptoms failed to improve so pt was started on anticoagulation with Xarelto.  He feels leg has improved.  Pt denies any history of DVT or PE.  He denies any bleeding or bruising.  Pt had left LE doppler done 03/18/2018 that showed  IMPRESSION: No evidence of deep venous thrombosis. Great saphenous vein thrombophlebitis from the level of the ankle to upper calf. These can propagate and developed into the venous thrombosis and therefore close interval follow-up or if clinically necessary, anticoagulation should be considered.  Pt reports he was taking NSAIDS  and failed to note improvement.  He had repeat imaging done 03/27/2018 that showed  IMPRESSION: Persistent left GSV superficial thrombophlebitis. It is likely too early to detect any significant resolution. While there may be slightly higher thrombus in the GSV to just above the knee, there clearly is no evidence of deep venous thrombosis.  Pt has repeat imaging done 04/02/2018 that showed  IMPRESSION: Diffuse left GSV superficial thrombosis/thrombophlebitis, unchanged.  Negative for left lower extremity DVT.  He has undergone evaluation for a  left quadriceps a mass that he noticed within the past couple of weeks.  He is unsure if it was present before.  He reports he had a lipoma removed from left chest wall in the past.  He denies the area is painful.    Pt has family history of father with clots and is on coumadin.  Father reportedly has Factor V Leiden.  Pt requested testing.  He denies smoking.  He thinks that it was not present before.  He does have a history of lipoma removal from his left  chest wall.  The mass is nonpainful.  He is scheduled for MRI of area reportedly on Monday 05/25/2018.  Pt is wondering when he can come off anticoagulation.  He is seen today for consultation due to Factor V leiden and superficial thrombophlebitis.    Problem List Patient Active Problem List   Diagnosis Date Noted  . Thrombophlebitis of superficial veins of left lower extremity [I80.02] 04/16/2018  . Family history of bleeding or clotting disorder [Z83.2] 04/16/2018  . Mass of left thigh [R22.42] 04/16/2018    Past Medical History Past Medical History:  Diagnosis Date  . Family history of bleeding or clotting disorder 04/16/2018  . Mass of left thigh 04/16/2018  . Thrombophlebitis of superficial veins of left lower extremity 04/16/2018    Past Surgical History Past Surgical History:  Procedure Laterality Date  . FOOT SURGERY    . GANGLION CYST EXCISION    . TONSILLECTOMY      Family History Family History  Problem Relation Age of Onset  . Factor V Leiden deficiency Father      Social History  reports that he has never smoked. He has never used smokeless tobacco. He reports that he does not drink alcohol or use drugs.  Medications  Current Outpatient Medications:  .  rivaroxaban (XARELTO) 20 MG TABS tablet, Take 1 tablet (20 mg total) by mouth daily with supper., Disp: 70 tablet, Rfl: 0 .  ibuprofen (ADVIL,MOTRIN) 600 MG tablet, Take 1 tablet (600 mg total) by mouth every 6 (six) hours as needed. (Patient not taking: Reported on 05/21/2018), Disp: 20 tablet, Rfl: 0 .  Rivaroxaban 15 & 20 MG TBPK, Take as directed on package: Start with one 15mg  tablet by mouth twice a day with food. On Day 22, switch to one 20mg  tablet once a day with food. (Patient not taking: Reported on 05/21/2018), Disp: 51 each, Rfl: 0  Allergies Patient has no known allergies.  Review of Systems Review of Systems - Oncology ROS negative   Physical Exam  Vitals Wt Readings from Last 3 Encounters:   05/21/18 228 lb 6 oz (103.6 kg)  04/16/18 234 lb 9.6 oz (106.4 kg)  04/02/18 210 lb (95.3 kg)   Temp Readings from Last 3 Encounters:  05/21/18 97.9 F (36.6 C) (Oral)  04/16/18 98 F (36.7 C) (Oral)  04/02/18 98.4 F (36.9 C) (Oral)   BP Readings from Last 3 Encounters:  05/21/18 130/74  04/16/18 129/67  04/02/18 130/80   Pulse Readings from Last 3 Encounters:  05/21/18 (!) 47  04/16/18 (!) 46  04/02/18 64   Constitutional: Well-developed, well-nourished, and in no distress.   HENT: Head: Normocephalic and atraumatic.  Mouth/Throat: No oropharyngeal exudate. Mucosa moist. Eyes: Pupils are equal, round, and reactive to light. Conjunctivae are normal. No scleral icterus.  Neck: Normal range of motion. Neck supple. No JVD present.  Cardiovascular: Normal rate, regular rhythm and normal heart sounds.  Exam reveals no gallop and no friction rub.   No murmur heard. Pulmonary/Chest: Effort normal and breath sounds normal. No respiratory distress. No wheezes.No rales.  Abdominal: Soft. Bowel sounds are normal. No distension. There is no tenderness. There is no guarding.  Musculoskeletal: No edema or tenderness. Nurse Chaperone present.  Pt has palpable area on left thigh.  Non-tender.   Lymphadenopathy: No cervical,axillary or supraclavicular adenopathy.  Neurological: Alert and oriented to person, place, and time. No cranial nerve deficit.  Skin: Skin is warm and dry. No rash noted. No erythema. No pallor.  Linear scar noted on left chest wall from reported prior lipoma surgery.   Psychiatric: Affect and judgment normal.   Labs No visits with results within 3 Day(s) from this visit.  Latest known visit with results is:  Office Visit on 04/16/2018  Component Date Value Ref Range Status  . WBC 04/16/2018 4.4  3.8 - 10.8 Thousand/uL Final  . RBC 04/16/2018 5.34  4.20 - 5.80 Million/uL Final  . Hemoglobin 04/16/2018 15.9  13.2 - 17.1 g/dL Final  . HCT 04/16/2018 45.8  38.5 - 50.0  % Final  . MCV 04/16/2018 85.8  80.0 - 100.0 fL Final  . MCH 04/16/2018 29.8  27.0 - 33.0 pg Final  . MCHC 04/16/2018 34.7  32.0 - 36.0 g/dL Final  . RDW 04/16/2018 12.6  11.0 - 15.0 % Final  . Platelets 04/16/2018 230  140 - 400 Thousand/uL Final  . MPV 04/16/2018 10.6  7.5 - 12.5 fL Final  . Glucose, Bld 04/16/2018 92  65 - 139 mg/dL Final   Comment: .        Non-fasting reference interval .   . BUN 04/16/2018 12  7 - 25 mg/dL Final  . Creat 04/16/2018 1.18  0.60 - 1.35 mg/dL Final  . GFR, Est Non African American 04/16/2018 86  > OR = 60 mL/min/1.38m2 Final  . GFR, Est African American 04/16/2018 100  > OR = 60 mL/min/1.63m2 Final  . BUN/Creatinine Ratio 44/81/8563 NOT APPLICABLE  6 - 22 (calc) Final  . Sodium 04/16/2018 140  135 - 146 mmol/L Final  . Potassium 04/16/2018 4.4  3.5 - 5.3 mmol/L Final  . Chloride 04/16/2018 104  98 - 110 mmol/L Final  . CO2 04/16/2018 28  20 - 32 mmol/L Final  . Calcium 04/16/2018 9.2  8.6 - 10.3 mg/dL Final  . Total Protein 04/16/2018 6.4  6.1 - 8.1 g/dL Final  . Albumin 04/16/2018 4.8  3.6 - 5.1 g/dL Final  . Globulin 04/16/2018 1.6* 1.9 - 3.7 g/dL (calc) Final  . AG Ratio 04/16/2018 3.0* 1.0 - 2.5 (calc) Final  . Total Bilirubin 04/16/2018 0.8  0.2 - 1.2 mg/dL Final  . Alkaline phosphatase (APISO) 04/16/2018 18* 36 - 130 U/L Final  . AST 04/16/2018 14  10 - 40 U/L Final  . ALT 04/16/2018 16  9 - 46 U/L Final  . Cholesterol 04/16/2018 184  <200 mg/dL Final  . HDL 04/16/2018 45  >  OR = 40 mg/dL Final  . Triglycerides 04/16/2018 57  <150 mg/dL Final  . LDL Cholesterol (Calc) 04/16/2018 125* mg/dL (calc) Final   Comment: Reference range: <100 . Desirable range <100 mg/dL for primary prevention;   <70 mg/dL for patients with CHD or diabetic patients  with > or = 2 CHD risk factors. Marland Kitchen LDL-C is now calculated using the Martin-Hopkins  calculation, which is a validated novel method providing  better accuracy than the Friedewald equation in the   estimation of LDL-C.  Cresenciano Genre et al. Annamaria Helling. 6578;469(62): 2061-2068  (http://education.[facility].com/faq/FAQ164)   . Total CHOL/HDL Ratio 04/16/2018 4.1  <5.0 (calc) Final  . Non-HDL Cholesterol (Calc) 04/16/2018 139* <130 mg/dL (calc) Final   Comment: For patients with diabetes plus 1 major ASCVD risk  factor, treating to a non-HDL-C goal of <100 mg/dL  (LDL-C of <70 mg/dL) is considered a therapeutic  option.   . TSH 04/16/2018 1.06  0.40 - 4.50 mIU/L Final  . Result 04/16/2018 SEE NOTE   Final   Comment: RESULT: POSITIVE FOR ONE COPY OF THE FACTOR V LEIDEN (R506Q) VARIANT   . INTERPRETATION 04/16/2018 SEE NOTE   Final   Comment: INTERPRETATION: This individual is heterozygous for the Factor V Leiden (R506Q) variant in the Factor V gene. Heterozygotes have a 3-8-fold increased risk for venous thrombosis. In addition, other family members may also have this variant. Consider genetic counseling and DNA testing for at-risk family members. . Laboratory testing supervised and results monitored by Gerilyn Nestle, MD, PhD, [facility], CGMBS. Marland Kitchen MUTATION ANALYSIS: The Factor V Leiden (R506Q) mutation [NM 000130.2: c.1601G>A (p.R534Q)] in the Factor V gene is one of the most common causes of inherited thrombophilia. This mutation causes resistance to degradation of activated Factor V protein by activated protein C (APC). The Factor V Leiden (R506Q) mutation is detected by amplification of the selected region of Factor V gene by polymerase chain reaction (PCR) and fluorescent probe hybridization to the targeted region, followed by melting curve analysis with a real time PCR system. Although rare, false positive                           or false negative results may occur. All results should be interpreted in context of clinical findings, relevant history, and other laboratory data. . This test was developed and its analytical performance characteristics have been determined by  [facility]. It has not been cleared or approved by FDA. This assay has been validated pursuant to the CLIA regulations and is used for clinical purposes. . Health care providers, please contact your local [place]' genetic counselor or call [phone] [phone]) for assistance with interpretation of these results. .   . PROTHROMBIN (FACTOR II) 04/16/2018 SEE NOTE   Final   RESULT: G20210A variant not detected  . Interpretation 04/16/2018 SEE NOTE   Final   Comment: INTERPRETATION: This individual is negative (normal) for the G20210A variant in the Prothrombin/Factor II gene. Increased risk of thrombophilia can be caused by a variety of genetic and non-genetic factors not screened for by this assay. . Laboratory testing supervised and results monitored by Matt Holmes, Ph.D., [facility], CGMBS. Marland Kitchen The G20210A mutation [[id number]: g.21538G>A (c.*97G>A)] in the Prothrombin/Factor II gene is the second most common inherited risk factor for thrombosis occurring in approximately 2% of Caucasians. Presence of the mutation is associated with an elevation of prothrombin levels to about 30% above normal in heterozygotes  and to 70% above normal in homozygotes. . Prothrombin (G20210A) mutations are detected by amplification of their selected gene regions by polymerase chain reaction (PCR) and fluorescent probe hybridization to the targeted region, followed by melting curve analysis with a real time PCR system. Although rare, false positive o                          r false negative results may occur. All results should be interpreted in context of clinical findings, relevant history, and other laboratory data. . Health care providers, please contact your local [place]' genetic counselor or call [phone] [phone]) for assistance with interpretation of these results. . This test was developed and its  analytical performance characteristics have been determined by [facility]. It has not been cleared or approved by FDA. This assay has been validated pursuant to the CLIA regulations and is used for clinical purposes. .   . INR 04/16/2018 1.2*  Final   Comment: Reference Range                     0.9-1.1 Moderate-intensity Warfarin Therapy 2.0-3.0 Higher-intensity Warfarin Therapy   3.0-4.0  .   Marland Kitchen Prothrombin Time 04/16/2018 12.2* 9.0 - 11.5 sec Final   Comment: . For more information on this test, go to: http://education.[facility].com/faq/FAQ104 .   Marland Kitchen AntiThromb III Func 04/16/2018 115  80 - 120 % activity Final  . Protein C Activity 04/16/2018 159  70 - 180 % Final   Comment: . Units: % of normal .   . Protein S Activity 04/16/2018 171* 70 - 150 % Final   Comment: . An elevated Protein S activity is not clinically significant. Only deficiencies are associated with an increased thrombotic risk. .   . INTERPRETATION 04/16/2018 see note   Final   Comment: The antiphospholipid antibody syndrome (APS) is a clinical-pathologic correlation that includes a clinical event (e.g.  thrombosis, pregnancy loss, thrombocytopenia) and persistent positive antiphospho- lipid antibodies (IgM or IgG ACA >40 MPL/GPL, IgM or IgG anti-b2GPI antibodies or a lupus anticoagulant). International consensus guidelines for APS suggest waiting at least 12 weeks before retesting to confirm antibody persistence. The Systemic Lupus International [facility] immunological classification criteria for systemic lupus erythematosus (SLE) include testing for isotype IgA, which has yet to be incorpo- rated into APS criteria. Low level antiphospholipid antibodies may sometimes be detected in the setting of infection, drug therapy or aging.   . Beta-2 Glyco I IgG 04/16/2018 <9  <=20 SGU Final  . Beta-2 Glyco 1 IgA 04/16/2018 <9  <=20 SAU Final   . Beta-2 Glyco 1 IgM 04/16/2018 <9  <=20 SMU Final  . PHOSPHATIDYLSERINE AB (IGA) 04/16/2018 <20  <20 U/mL Final   Comment: . Phosphatidylserine (IgA) Reference range:   <20 U/mL  Negative 20-30 U/mL  Equivocal - Found in small percentage             of the healthy population; may be reactive   >30 U/mL  Positive - Risk factor for thrombosis .   Marland Kitchen PHOSPHATIDYLSERINE AB  (IGG) 04/16/2018 <10  <10 U/mL Final   Comment: . Phosphatidylserine (IgG) Reference range:   <10 U/mL  Negative 10-20 U/mL  Equivocal - Found in small percentage             of the healthy population; may be reactive   >20 U/mL  Positive - Risk factor for thrombosis  and pregnancy loss. .   Orpha Bur AB  (IGM) 04/16/2018 <25  <25 U/mL Final   Comment: . Phosphatidylserine (IgM) Reference range:   <25 U/mL  Negative 25-35 U/mL  Equivocal - Found in small percentage             of the healthy population; may be reactive   >35 U/mL  Positive - Risk factor for thrombosis .   Marland Kitchen Anticardiolipin IgA 04/16/2018 <11  <=11 APL Final   Comment: . Cardiolipin Ab (IgA) Reference Range: . Value           Interpretation -----------     ---------------- < or = 11       Negative 12 - 20         Indeterminate 21 - 80         Low to Medium Positive > 80            High Positive .   Marland Kitchen Anticardiolipin IgG 04/16/2018 <14  <=14 GPL Final   Comment: . Cardiolipin Ab (IgG) Reference Range: . Value           Interpretation -----------     ---------------- < or = 14       Negative 15 - 20         Indeterminate 21 - 80         Low to Medium Positive > 80            High Positive .   Marland Kitchen Anticardiolipin IgM 04/16/2018 <12  <=12 MPL Final   Comment: . Cardiolipin Ab (IgM) Reference Range: . Value           Interpretation -----------     ---------------- < or = 12       Negative 13 - 20         Indeterminate 21 - 80         Low to Medium Positive > 80            High Positive .       Pathology No orders of the defined types were placed in this encounter.      Zoila Shutter MD

## 2018-05-25 ENCOUNTER — Other Ambulatory Visit: Payer: Self-pay

## 2018-05-25 ENCOUNTER — Ambulatory Visit (INDEPENDENT_AMBULATORY_CARE_PROVIDER_SITE_OTHER): Payer: Managed Care, Other (non HMO)

## 2018-05-25 DIAGNOSIS — I8002 Phlebitis and thrombophlebitis of superficial vessels of left lower extremity: Secondary | ICD-10-CM | POA: Diagnosis not present

## 2018-05-25 DIAGNOSIS — R2242 Localized swelling, mass and lump, left lower limb: Secondary | ICD-10-CM | POA: Diagnosis not present

## 2018-05-25 MED ORDER — GADOBENATE DIMEGLUMINE 529 MG/ML IV SOLN
20.0000 mL | Freq: Once | INTRAVENOUS | Status: AC | PRN
  Administered 2018-05-25: 20 mL via INTRAVENOUS

## 2018-07-21 ENCOUNTER — Telehealth: Payer: Self-pay | Admitting: *Deleted

## 2018-07-21 NOTE — Telephone Encounter (Signed)
Patient called to inquire why he had upcoming appointments.  Upcoming appt was to go over MRI results of which Dr Sheppard Coil PCP already completed and did additional bloodwork.    Patient states he would have to ask off for work and doesn't feel that this visit is necessary.

## 2018-07-22 ENCOUNTER — Ambulatory Visit: Payer: Managed Care, Other (non HMO) | Admitting: Internal Medicine

## 2018-10-06 ENCOUNTER — Telehealth: Payer: Self-pay

## 2018-10-06 DIAGNOSIS — R2242 Localized swelling, mass and lump, left lower limb: Secondary | ICD-10-CM

## 2018-10-06 NOTE — Telephone Encounter (Signed)
referred to surgery

## 2018-10-06 NOTE — Telephone Encounter (Signed)
Pt left a vm msg stating he would like to move forward with removal of tumor on his thigh. As per pt, requesting feed back from provider on how to proceed. Pls advise, thanks.

## 2018-10-07 NOTE — Telephone Encounter (Signed)
Left a detailed vm msg for pt regarding referral for surgery. Direct call back info provided.

## 2018-11-06 ENCOUNTER — Ambulatory Visit: Payer: Self-pay | Admitting: Surgery

## 2018-11-06 NOTE — H&P (Signed)
[name] Documented: 11/06/2018 10:26 AM Location: [facility] Patient #: [id number] DOB: 12/12/1995 Single / Language: Christopher Grimes / Race: White Male  History of Present Illness [name] A. Candance Bohlman MD; 11/06/2018 11:00 AM) Patient words: Pt sent at the request of Christopher Reeve, DO for a 6 month history of left medial thigh mass. He was evaluated in April of this year with magnetic resonance imaging which shows a 8 centimeter intramuscular mass involving the left vastus medialis. MR findings consistent with lipoma without complicating features. It is not causing pain. It is noticeable and he desires removal. He denies any lower leg numbness or radiation of pain down his leg. He denies any redness or fluctuance. It does not appear to be limiting him but he is concerned it might be getting bigger.                Left medial thigh mass.  EXAM: MR OF THE LEFT LOWER EXTREMITY WITHOUT AND WITH CONTRAST  TECHNIQUE: Multiplanar, multisequence MR imaging of the left thigh was performed both before and after administration of intravenous contrast.  CONTRAST: 36mL MULTIHANCE GADOBENATE DIMEGLUMINE 529 MG/ML IV SOLN  COMPARISON: Left femur x-rays dated April 16, 2018.  FINDINGS: Bones/Joint/Cartilage  No marrow signal abnormality. No fracture or dislocation. Normal alignment. No joint effusion. No focal bone lesion.  Ligaments  The bilateral ACL, PCL, MCL, and LCL complexes are grossly intact.  Muscles and Tendons There is a 4.3 x 3.4 x 8.2 cm (AP by transverse by CC) fat signal intensity mass in the mid to distal vastus medialis muscle. The mass contains a few thin septa and internal muscle striations. No significant areas of increased T2 signal or enhancement. No muscle edema or atrophy.  Soft tissue No fluid collection or hematoma. No soft tissue mass.  IMPRESSION: 1. Right vastus medialis 4.3 x 3.4 x 8.2 cm intramuscular  lipoma.   Electronically Signed By: Titus Dubin M.D. On: 05/25/2018 10:52.  The patient is a 23 year old male.   Past Surgical History Christopher Grimes, RMA; 11/06/2018 10:32 AM) Foot Surgery Right.  Allergies [name], RMA; 11/06/2018 10:27 AM) No Known Drug Allergies [11/06/2018]: Allergies Reconciled  Medication History [name], RMA; 11/06/2018 10:27 AM) No Current Medications Medications Reconciled  Social History Christopher Grimes, RMA; 11/06/2018 10:32 AM) Alcohol use Occasional alcohol use. Caffeine use Carbonated beverages, Tea. No drug use Tobacco use Never smoker.  Family History Christopher Grimes; 11/06/2018 10:32 AM) First Degree Relatives No pertinent family history  Other Problems Christopher Grimes, RMA; 11/06/2018 10:32 AM) No pertinent past medical history    Vitals Christopher Bers Haggett RMA; 11/06/2018 10:27 AM) 11/06/2018 10:27 AM Weight: 216 lb Height: 68in Body Surface Area: 2.11 m Body Mass Index: 32.84 kg/m  Temp.: 98.22F(Temporal)  Pulse: 75 (Regular)  P.OX: 96% (Room air) BP: 128/80 (Sitting, Left Arm, Standard)        Physical Exam ([name] [name] MD; 11/06/2018 11:04 AM)  General Mental Status-Alert. General Appearance-Consistent with stated age. Hydration-Well hydrated. Voice-Normal.  Head and Neck Head-normocephalic, atraumatic with no lesions or palpable masses. Trachea-midline. Thyroid Gland Characteristics - normal size and consistency.  Chest and Lung Exam Chest and lung exam reveals -quiet, even and easy respiratory effort with no use of accessory muscles and on auscultation, normal breath sounds, no adventitious sounds and normal vocal resonance. Inspection Chest Wall - Normal. Back - normal.  Cardiovascular Cardiovascular examination reveals -normal heart sounds, regular rate and rhythm with no murmurs and normal pedal pulses  bilaterally.  Abdomen Inspection Inspection of the abdomen reveals - No Hernias. Skin - Scar - no surgical scars. Palpation/Percussion Palpation and Percussion of the abdomen reveal - Soft, Non Tender, No Rebound tenderness, No Rigidity (guarding) and No hepatosplenomegaly. Auscultation Auscultation of the abdomen reveals - Bowel sounds normal.  Neurologic Neurologic evaluation reveals -alert and oriented x 3 with no impairment of recent or remote memory. Mental Status-Normal.  Musculoskeletal Note: medial left thigh is a mobile 8 cm mass within the left vastus medialis. Motor and sensory function left lower extremity are normal today.    Assessment & Plan ([name] A. [name] MD; 11/06/2018 11:02 AM)  MASS OF LEFT THIGH (R22.42) Impression: Pt desires excision lipoma involving left vastus medialis which is subfascial. I discussed potential risks of bleeding, infection, nerve injury, numbness, vascular injury limitation of function, and the need for treatments in her procedures depending on final pathology.  Current Plans Pt Education - [facility] Free Text Education/Instructions: discussed with patient and provided information. Pt Education - [facility] General Post-op HCI The pathophysiology of skin & subcutaneous masses was discussed. Natural history risks without surgery were discussed. I recommended surgery to remove the mass. I explained the technique of removal with use of local anesthesia & possible need for more aggressive sedation/anesthesia for patient comfort.  Risks such as bleeding, infection, wound breakdown, heart attack, death, and other risks were discussed. I noted a good likelihood this will help address the problem. Possibility that this will not correct all symptoms was explained. Possibility of regrowth/recurrence of the mass was discussed. We will work to minimize complications. Questions were answered. The patient expresses understanding & wishes to proceed with  surgery.

## 2018-12-17 ENCOUNTER — Other Ambulatory Visit: Payer: Self-pay

## 2018-12-17 ENCOUNTER — Encounter (HOSPITAL_BASED_OUTPATIENT_CLINIC_OR_DEPARTMENT_OTHER): Payer: Self-pay | Admitting: *Deleted

## 2019-01-02 ENCOUNTER — Other Ambulatory Visit (HOSPITAL_COMMUNITY)
Admission: RE | Admit: 2019-01-02 | Discharge: 2019-01-02 | Disposition: A | Discharge status: Home / Self Care | Payer: Managed Care, Other (non HMO) | Source: Ambulatory Visit | Attending: Surgery | Admitting: Surgery

## 2019-01-02 DIAGNOSIS — Z01812 Encounter for preprocedural laboratory examination: Secondary | ICD-10-CM | POA: Insufficient documentation

## 2019-01-02 DIAGNOSIS — Z20828 Contact with and (suspected) exposure to other viral communicable diseases: Secondary | ICD-10-CM | POA: Diagnosis not present

## 2019-01-04 ENCOUNTER — Other Ambulatory Visit: Payer: Self-pay

## 2019-01-04 ENCOUNTER — Encounter (HOSPITAL_BASED_OUTPATIENT_CLINIC_OR_DEPARTMENT_OTHER)
Admission: RE | Admit: 2019-01-04 | Discharge: 2019-01-04 | Disposition: A | Discharge status: Home / Self Care | Payer: Managed Care, Other (non HMO) | Source: Ambulatory Visit | Attending: Surgery | Admitting: Surgery

## 2019-01-04 DIAGNOSIS — D1724 Benign lipomatous neoplasm of skin and subcutaneous tissue of left leg: Secondary | ICD-10-CM | POA: Diagnosis present

## 2019-01-04 DIAGNOSIS — D179 Benign lipomatous neoplasm, unspecified: Secondary | ICD-10-CM | POA: Diagnosis not present

## 2019-01-04 LAB — CBC WITH DIFFERENTIAL/PLATELET
Abs Immature Granulocytes: 0.01 10*3/uL (ref 0.00–0.07)
Basophils Absolute: 0 10*3/uL (ref 0.0–0.1)
Basophils Relative: 1 %
Eosinophils Absolute: 0.1 10*3/uL (ref 0.0–0.5)
Eosinophils Relative: 2 %
HCT: 50.5 % (ref 39.0–52.0)
Hemoglobin: 17.1 g/dL — ABNORMAL HIGH (ref 13.0–17.0)
Immature Granulocytes: 0 %
Lymphocytes Relative: 33 %
Lymphs Abs: 1.6 10*3/uL (ref 0.7–4.0)
MCH: 30.4 pg (ref 26.0–34.0)
MCHC: 33.9 g/dL (ref 30.0–36.0)
MCV: 89.7 fL (ref 80.0–100.0)
Monocytes Absolute: 0.4 10*3/uL (ref 0.1–1.0)
Monocytes Relative: 9 %
Neutro Abs: 2.7 10*3/uL (ref 1.7–7.7)
Neutrophils Relative %: 55 %
Platelets: 230 10*3/uL (ref 150–400)
RBC: 5.63 MIL/uL (ref 4.22–5.81)
RDW: 12 % (ref 11.5–15.5)
WBC: 4.9 10*3/uL (ref 4.0–10.5)
nRBC: 0 % (ref 0.0–0.2)

## 2019-01-04 LAB — COMPREHENSIVE METABOLIC PANEL
ALT: 22 U/L (ref 0–44)
AST: 21 U/L (ref 15–41)
Albumin: 4.3 g/dL (ref 3.5–5.0)
Alkaline Phosphatase: 20 U/L — ABNORMAL LOW (ref 38–126)
Anion gap: 11 (ref 5–15)
BUN: 15 mg/dL (ref 6–20)
CO2: 28 mmol/L (ref 22–32)
Calcium: 9.5 mg/dL (ref 8.9–10.3)
Chloride: 102 mmol/L (ref 98–111)
Creatinine, Ser: 1.18 mg/dL (ref 0.61–1.24)
GFR calc Af Amer: >60 mL/min (ref >60)
GFR calc non Af Amer: >60 mL/min (ref >60)
Glucose, Bld: 100 mg/dL — ABNORMAL HIGH (ref 70–99)
Potassium: 5 mmol/L (ref 3.5–5.1)
Sodium: 141 mmol/L (ref 135–145)
Total Bilirubin: 0.8 mg/dL (ref 0.3–1.2)
Total Protein: 6.5 g/dL (ref 6.5–8.1)

## 2019-01-04 LAB — NOVEL CORONAVIRUS, NAA (HOSP ORDER, SEND-OUT TO REF LAB; TAT 18-24 HRS): SARS-CoV-2, NAA: NOT DETECTED

## 2019-01-04 NOTE — Progress Notes (Signed)

## 2019-01-06 ENCOUNTER — Ambulatory Visit (HOSPITAL_BASED_OUTPATIENT_CLINIC_OR_DEPARTMENT_OTHER)
Admission: RE | Admit: 2019-01-06 | Discharge: 2019-01-06 | Disposition: A | Discharge status: Home / Self Care | Payer: Managed Care, Other (non HMO) | Source: Ambulatory Visit | Attending: Surgery | Admitting: Surgery

## 2019-01-06 ENCOUNTER — Ambulatory Visit (HOSPITAL_BASED_OUTPATIENT_CLINIC_OR_DEPARTMENT_OTHER): Payer: Managed Care, Other (non HMO) | Admitting: Certified Registered"

## 2019-01-06 ENCOUNTER — Other Ambulatory Visit: Payer: Self-pay

## 2019-01-06 ENCOUNTER — Encounter (HOSPITAL_BASED_OUTPATIENT_CLINIC_OR_DEPARTMENT_OTHER)
Admission: RE | Disposition: A | Discharge status: Home / Self Care | Payer: Self-pay | Source: Ambulatory Visit | Attending: Surgery

## 2019-01-06 ENCOUNTER — Encounter (HOSPITAL_BASED_OUTPATIENT_CLINIC_OR_DEPARTMENT_OTHER): Payer: Self-pay | Admitting: *Deleted

## 2019-01-06 DIAGNOSIS — D179 Benign lipomatous neoplasm, unspecified: Secondary | ICD-10-CM | POA: Diagnosis not present

## 2019-01-06 HISTORY — PX: MASS EXCISION: SHX2000

## 2019-01-06 SURGERY — EXCISION MASS
Anesthesia: General | Site: Thigh | Laterality: Left

## 2019-01-06 MED ORDER — ACETAMINOPHEN 500 MG PO TABS
ORAL_TABLET | ORAL | Status: AC
  Filled 2019-01-06: qty 2

## 2019-01-06 MED ORDER — FENTANYL CITRATE (PF) 100 MCG/2ML IJ SOLN
INTRAMUSCULAR | Status: AC
  Filled 2019-01-06: qty 2

## 2019-01-06 MED ORDER — SODIUM CHLORIDE (PF) 0.9 % IJ SOLN
INTRAMUSCULAR | Status: AC
  Filled 2019-01-06: qty 10

## 2019-01-06 MED ORDER — OXYCODONE HCL 5 MG/5ML PO SOLN: 5.0000 mg | Freq: Once | ORAL | Status: DC | PRN

## 2019-01-06 MED ORDER — PROPOFOL 10 MG/ML IV BOLUS
INTRAVENOUS | Status: AC
  Filled 2019-01-06: qty 40

## 2019-01-06 MED ORDER — BUPIVACAINE-EPINEPHRINE 0.25% -1:200000 IJ SOLN
INTRAMUSCULAR | Status: DC | PRN
  Administered 2019-01-06: 20 mL

## 2019-01-06 MED ORDER — FENTANYL CITRATE (PF) 100 MCG/2ML IJ SOLN
INTRAMUSCULAR | Status: DC | PRN
  Administered 2019-01-06: 50 ug via INTRAVENOUS
  Administered 2019-01-06: 100 ug via INTRAVENOUS
  Administered 2019-01-06: 50 ug via INTRAVENOUS

## 2019-01-06 MED ORDER — ONDANSETRON HCL 4 MG/2ML IJ SOLN
INTRAMUSCULAR | Status: DC | PRN
  Administered 2019-01-06: 4 mg via INTRAVENOUS

## 2019-01-06 MED ORDER — FENTANYL CITRATE (PF) 100 MCG/2ML IJ SOLN: 25.0000 ug | INTRAMUSCULAR | Status: DC | PRN

## 2019-01-06 MED ORDER — CEFAZOLIN SODIUM-DEXTROSE 2-4 GM/100ML-% IV SOLN
2.0000 g | INTRAVENOUS | Status: AC
  Administered 2019-01-06: 2 g via INTRAVENOUS

## 2019-01-06 MED ORDER — PROPOFOL 10 MG/ML IV BOLUS
INTRAVENOUS | Status: DC | PRN
  Administered 2019-01-06: 200 mg via INTRAVENOUS

## 2019-01-06 MED ORDER — LIDOCAINE HCL (CARDIAC) PF 100 MG/5ML IV SOSY
PREFILLED_SYRINGE | INTRAVENOUS | Status: DC | PRN
  Administered 2019-01-06: 50 mg via INTRAVENOUS

## 2019-01-06 MED ORDER — GABAPENTIN 300 MG PO CAPS
300.0000 mg | ORAL_CAPSULE | ORAL | Status: AC
  Administered 2019-01-06: 300 mg via ORAL

## 2019-01-06 MED ORDER — LIDOCAINE 2% (20 MG/ML) 5 ML SYRINGE
INTRAMUSCULAR | Status: AC
  Filled 2019-01-06: qty 5

## 2019-01-06 MED ORDER — ONDANSETRON HCL 4 MG/2ML IJ SOLN
INTRAMUSCULAR | Status: AC
  Filled 2019-01-06: qty 2

## 2019-01-06 MED ORDER — OXYCODONE HCL 5 MG PO TABS: 5.0000 mg | ORAL_TABLET | Freq: Once | ORAL | Status: DC | PRN

## 2019-01-06 MED ORDER — ONDANSETRON HCL 4 MG/2ML IJ SOLN: 4.0000 mg | Freq: Once | INTRAMUSCULAR | Status: DC | PRN

## 2019-01-06 MED ORDER — LACTATED RINGERS IV SOLN
INTRAVENOUS | Status: DC
  Administered 2019-01-06: 08:00:00 via INTRAVENOUS

## 2019-01-06 MED ORDER — MIDAZOLAM HCL 2 MG/2ML IJ SOLN
INTRAMUSCULAR | Status: DC | PRN
  Administered 2019-01-06: 2 mg via INTRAVENOUS

## 2019-01-06 MED ORDER — CELECOXIB 200 MG PO CAPS
ORAL_CAPSULE | ORAL | Status: AC
  Filled 2019-01-06: qty 1

## 2019-01-06 MED ORDER — DEXAMETHASONE SODIUM PHOSPHATE 10 MG/ML IJ SOLN
INTRAMUSCULAR | Status: DC | PRN
  Administered 2019-01-06: 5 mg via INTRAVENOUS

## 2019-01-06 MED ORDER — OXYCODONE HCL 5 MG PO TABS: 5.0000 mg | ORAL_TABLET | Freq: Four times a day (QID) | ORAL | 0 refills | Status: AC | PRN

## 2019-01-06 MED ORDER — CELECOXIB 200 MG PO CAPS
200.0000 mg | ORAL_CAPSULE | ORAL | Status: AC
  Administered 2019-01-06: 200 mg via ORAL

## 2019-01-06 MED ORDER — GABAPENTIN 300 MG PO CAPS
ORAL_CAPSULE | ORAL | Status: AC
  Filled 2019-01-06: qty 1

## 2019-01-06 MED ORDER — BUPIVACAINE HCL (PF) 0.5 % IJ SOLN
INTRAMUSCULAR | Status: AC
  Filled 2019-01-06: qty 30

## 2019-01-06 MED ORDER — CHLORHEXIDINE GLUCONATE CLOTH 2 % EX PADS: 6.0000 | MEDICATED_PAD | Freq: Once | CUTANEOUS | Status: DC

## 2019-01-06 MED ORDER — DEXMEDETOMIDINE HCL IN NACL 200 MCG/50ML IV SOLN
INTRAVENOUS | Status: AC
  Filled 2019-01-06: qty 50

## 2019-01-06 MED ORDER — ACETAMINOPHEN 500 MG PO TABS
1000.0000 mg | ORAL_TABLET | ORAL | Status: AC
  Administered 2019-01-06: 1000 mg via ORAL

## 2019-01-06 MED ORDER — LIDOCAINE-EPINEPHRINE 1 %-1:100000 IJ SOLN
INTRAMUSCULAR | Status: AC
  Filled 2019-01-06: qty 1

## 2019-01-06 MED ORDER — DEXAMETHASONE SODIUM PHOSPHATE 10 MG/ML IJ SOLN
INTRAMUSCULAR | Status: AC
  Filled 2019-01-06: qty 1

## 2019-01-06 MED ORDER — MIDAZOLAM HCL 2 MG/2ML IJ SOLN
INTRAMUSCULAR | Status: AC
  Filled 2019-01-06: qty 2

## 2019-01-06 MED ORDER — BUPIVACAINE HCL (PF) 0.25 % IJ SOLN
INTRAMUSCULAR | Status: AC
  Filled 2019-01-06: qty 30

## 2019-01-06 MED ORDER — IBUPROFEN 800 MG PO TABS: 800.0000 mg | ORAL_TABLET | Freq: Three times a day (TID) | ORAL | 0 refills | Status: AC | PRN

## 2019-01-06 MED ORDER — CEFAZOLIN SODIUM-DEXTROSE 2-4 GM/100ML-% IV SOLN
INTRAVENOUS | Status: AC
  Filled 2019-01-06: qty 100

## 2019-01-06 SURGICAL SUPPLY — 49 items
BENZOIN TINCTURE PRP APPL 2/3 (GAUZE/BANDAGES/DRESSINGS) IMPLANT
BLADE CLIPPER SURG (BLADE) ×3 IMPLANT
BLADE SURG 10 STRL SS (BLADE) IMPLANT
BLADE SURG 15 STRL LF DISP TIS (BLADE) ×1 IMPLANT
BLADE SURG 15 STRL SS (BLADE) ×2
BNDG ELASTIC 4X5.8 VLCR STR LF (GAUZE/BANDAGES/DRESSINGS) IMPLANT
CANISTER SUCT 1200ML W/VALVE (MISCELLANEOUS) IMPLANT
CHLORAPREP W/TINT 26 (MISCELLANEOUS) ×3 IMPLANT
CLOSURE WOUND 1/2 X4 (GAUZE/BANDAGES/DRESSINGS)
COVER BACK TABLE REUSABLE LG (DRAPES) ×3 IMPLANT
COVER MAYO STAND REUSABLE (DRAPES) ×3 IMPLANT
COVER WAND RF STERILE (DRAPES) IMPLANT
DECANTER SPIKE VIAL GLASS SM (MISCELLANEOUS) IMPLANT
DERMABOND ADVANCED (GAUZE/BANDAGES/DRESSINGS) ×2
DERMABOND ADVANCED .7 DNX12 (GAUZE/BANDAGES/DRESSINGS) ×1 IMPLANT
DRAPE LAPAROTOMY 100X72 PEDS (DRAPES) ×3 IMPLANT
DRAPE UTILITY XL STRL (DRAPES) ×3 IMPLANT
ELECT COATED BLADE 2.86 ST (ELECTRODE) ×3 IMPLANT
ELECT REM PT RETURN 9FT ADLT (ELECTROSURGICAL) ×3
ELECTRODE REM PT RTRN 9FT ADLT (ELECTROSURGICAL) ×1 IMPLANT
GLOVE BIO SURGEON STRL SZ 6.5 (GLOVE) ×2 IMPLANT
GLOVE BIO SURGEONS STRL SZ 6.5 (GLOVE) ×1
GLOVE BIOGEL PI IND STRL 6.5 (GLOVE) ×1 IMPLANT
GLOVE BIOGEL PI IND STRL 7.0 (GLOVE) ×1 IMPLANT
GLOVE BIOGEL PI IND STRL 8 (GLOVE) ×1 IMPLANT
GLOVE BIOGEL PI INDICATOR 6.5 (GLOVE) ×2
GLOVE BIOGEL PI INDICATOR 7.0 (GLOVE) ×2
GLOVE BIOGEL PI INDICATOR 8 (GLOVE) ×2
GLOVE ECLIPSE 8.0 STRL XLNG CF (GLOVE) ×3 IMPLANT
GOWN STRL REUS W/ TWL LRG LVL3 (GOWN DISPOSABLE) ×2 IMPLANT
GOWN STRL REUS W/TWL LRG LVL3 (GOWN DISPOSABLE) ×4
NEEDLE HYPO 25X1 1.5 SAFETY (NEEDLE) ×3 IMPLANT
NS IRRIG 1000ML POUR BTL (IV SOLUTION) IMPLANT
PACK BASIN DAY SURGERY FS (CUSTOM PROCEDURE TRAY) ×3 IMPLANT
PENCIL SMOKE EVACUATOR (MISCELLANEOUS) ×3 IMPLANT
SLEEVE SCD COMPRESS KNEE MED (MISCELLANEOUS) ×3 IMPLANT
SPONGE LAP 4X18 RFD (DISPOSABLE) ×3 IMPLANT
STAPLER VISISTAT 35W (STAPLE) IMPLANT
STRIP CLOSURE SKIN 1/2X4 (GAUZE/BANDAGES/DRESSINGS) IMPLANT
SUT MON AB 4-0 PC3 18 (SUTURE) ×3 IMPLANT
SUT VIC AB 2-0 SH 27 (SUTURE) ×2
SUT VIC AB 2-0 SH 27XBRD (SUTURE) ×1 IMPLANT
SUT VICRYL 3-0 CR8 SH (SUTURE) ×3 IMPLANT
SUT VICRYL AB 3 0 TIES (SUTURE) IMPLANT
SYR CONTROL 10ML LL (SYRINGE) ×3 IMPLANT
TOWEL GREEN STERILE FF (TOWEL DISPOSABLE) ×6 IMPLANT
TUBE CONNECTING 20'X1/4 (TUBING)
TUBE CONNECTING 20X1/4 (TUBING) IMPLANT
YANKAUER SUCT BULB TIP NO VENT (SUCTIONS) IMPLANT

## 2019-01-06 NOTE — H&P (Signed)
Christopher Grimes Location: [facility] Patient #: [id number] DOB: April 02, 1995 Single / Language: Christopher Grimes / Race: White Male  History of Present Illness  Patient words: Pt sent at the request of [name], DO for a 6 month history of left medial thigh mass. He was evaluated in April of this year with magnetic resonance imaging which shows a 8 centimeter intramuscular mass involving the left vastus medialis. MR findings consistent with lipoma without complicating features. It is not causing pain. It is noticeable and he desires removal. He denies any lower leg numbness or radiation of pain down his leg. He denies any redness or fluctuance. It does not appear to be limiting him but he is concerned it might be getting bigger.                Left medial thigh mass.  EXAM: MR OF THE LEFT LOWER EXTREMITY WITHOUT AND WITH CONTRAST  TECHNIQUE: Multiplanar, multisequence MR imaging of the left thigh was performed both before and after administration of intravenous contrast.  CONTRAST: 39mL MULTIHANCE GADOBENATE DIMEGLUMINE 529 MG/ML IV SOLN  COMPARISON: Left femur x-rays dated April 16, 2018.  FINDINGS: Bones/Joint/Cartilage  No marrow signal abnormality. No fracture or dislocation. Normal alignment. No joint effusion. No focal bone lesion.  Ligaments  The bilateral ACL, PCL, MCL, and LCL complexes are grossly intact.  Muscles and Tendons There is a 4.3 x 3.4 x 8.2 cm (AP by transverse by CC) fat signal intensity mass in the mid to distal vastus medialis muscle. The mass contains a few thin septa and internal muscle striations. No significant areas of increased T2 signal or enhancement. No muscle edema or atrophy.  Soft tissue No fluid collection or hematoma. No soft tissue mass.  IMPRESSION: 1. Right vastus medialis 4.3 x 3.4 x 8.2 cm intramuscular lipoma.   Electronically Signed By: Titus Dubin  M.D. On: 05/25/2018 10:52.  The patient is a 23 year old male.   Past Surgical History  Foot Surgery Right.  Allergies  No Known Drug Allergies [11/06/2018]: Allergies Reconciled  Medication History  No Current Medications Medications Reconciled  Social History  Alcohol use Occasional alcohol use. Caffeine use Carbonated beverages, Tea. No drug use Tobacco use Never smoker.  Family History  First Degree Relatives No pertinent family history  Other Problems  No pertinent past medical history    Vitals  11/06/2018 10:27 AM Weight: 216 lb Height: 68in Body Surface Area: 2.11 m Body Mass Index: 32.84 kg/m  Temp.: 98.84F(Temporal)  Pulse: 75 (Regular)  P.OX: 96% (Room air) BP: 128/80 (Sitting, Left Arm, Standard)        Physical Exam   General Mental Status-Alert. General Appearance-Consistent with stated age. Hydration-Well hydrated. Voice-Normal.  Head and Neck Head-normocephalic, atraumatic with no lesions or palpable masses. Trachea-midline. Thyroid Gland Characteristics - normal size and consistency.  Chest and Lung Exam Chest and lung exam reveals -quiet, even and easy respiratory effort with no use of accessory muscles and on auscultation, normal breath sounds, no adventitious sounds and normal vocal resonance. Inspection Chest Wall - Normal. Back - normal.  Cardiovascular Cardiovascular examination reveals -normal heart sounds, regular rate and rhythm with no murmurs and normal pedal pulses bilaterally.  Abdomen Inspection Inspection of the abdomen reveals - No Hernias. Skin - Scar - no surgical scars. Palpation/Percussion Palpation and Percussion of the abdomen reveal - Soft, Non Tender, No Rebound tenderness, No Rigidity (guarding) and No hepatosplenomegaly. Auscultation Auscultation of the abdomen reveals - Bowel sounds normal.  Neurologic Neurologic evaluation reveals  -alert and oriented x 3 with no impairment of recent or remote memory. Mental Status-Normal.  Musculoskeletal Note: medial left thigh is a mobile 8 cm mass within the left vastus medialis. Motor and sensory function left lower extremity are normal today.    Assessment & Plan   MASS OF LEFT THIGH (R22.42) Impression: Pt desires excision lipoma involving left vastus medialis which is subfascial. I discussed potential risks of bleeding, infection, nerve injury, numbness, vascular injury limitation of function, and the need for treatments in her procedures depending on final pathology.  Current Plans Pt Education - [facility] Free Text Education/Instructions: discussed with patient and provided information. Pt Education - [facility] General Post-op HCI The pathophysiology of skin & subcutaneous masses was discussed. Natural history risks without surgery were discussed. I recommended surgery to remove the mass. I explained the technique of removal with use of local anesthesia & possible need for more aggressive sedation/anesthesia for patient comfort.  Risks such as bleeding, infection, wound breakdown, heart attack, death, and other risks were discussed. I noted a good likelihood this will help address the problem. Possibility that this will not correct all symptoms was explained. Possibility of regrowth/recurrence of the mass was discussed. We will work to minimize complications. Questions were answered. The patient expresses understanding & wishes to proceed with surg

## 2019-01-06 NOTE — Discharge Instructions (Signed)
GENERAL SURGERY: POST OP INSTRUCTIONS  ######################################################################  EAT Gradually transition to a high fiber diet with a fiber supplement over the next few weeks after discharge.  Start with a pureed / full liquid diet (see below)  WALK Walk an hour a day.  Control your pain to do that.    CONTROL PAIN Control pain so that you can walk, sleep, tolerate sneezing/coughing, go up/down stairs.  HAVE A BOWEL MOVEMENT DAILY Keep your bowels regular to avoid problems.  OK to try a laxative to override constipation.  OK to use an antidairrheal to slow down diarrhea.  Call if not better after 2 tries  CALL IF YOU HAVE PROBLEMS/CONCERNS Call if you are still struggling despite following these instructions. Call if you have concerns not answered by these instructions  ######################################################################    1. DIET: Follow a light bland diet & liquids the first 24 hours after arrival home, such as soup, liquids, starches, etc.  Be sure to drink plenty of fluids.  Quickly advance to a usual solid diet within a few days.  Avoid fast food or heavy meals as your are more likely to get nauseated or have irregular bowels.  A low-fat, high-fiber diet for the rest of your life is ideal.   2. Take your usually prescribed home medications unless otherwise directed. 3. PAIN CONTROL: a. Pain is best controlled by a usual combination of three different methods TOGETHER: i. Ice/Heat ii. Over the counter pain medication iii. Prescription pain medication b. Most patients will experience some swelling and bruising around the incisions.  Ice packs or heating pads (30-60 minutes up to 6 times a day) will help. Use ice for the first few days to help decrease swelling and bruising, then switch to heat to help relax tight/sore spots and speed recovery.  Some people prefer to use ice alone, heat alone, alternating between ice & heat.   Experiment to what works for you.  Swelling and bruising can take several weeks to resolve.   c. It is helpful to take an over-the-counter pain medication regularly for the first few weeks.  Choose one of the following that works best for you: i. Naproxen (Aleve, etc)  Two 220mg tabs twice a day ii. Ibuprofen (Advil, etc) Three 200mg tabs four times a day (every meal & bedtime) iii. Acetaminophen (Tylenol, etc) 500-650mg four times a day (every meal & bedtime) d. A  prescription for pain medication (such as oxycodone, hydrocodone, etc) should be given to you upon discharge.  Take your pain medication as prescribed.  i. If you are having problems/concerns with the prescription medicine (does not control pain, nausea, vomiting, rash, itching, etc), please call us [phone] to see if we need to switch you to a different pain medicine that will work better for you and/or control your side effect better. ii. If you need a refill on your pain medication, please contact your pharmacy.  They will contact our office to request authorization. Prescriptions will not be filled after 5 pm or on week-ends. 4. Avoid getting constipated.  Between the surgery and the pain medications, it is common to experience some constipation.  Increasing fluid intake and taking a fiber supplement (such as Metamucil, Citrucel, FiberCon, MiraLax, etc) 1-2 times a day regularly will usually help prevent this problem from occurring.  A mild laxative (prune juice, Milk of Magnesia, MiraLax, etc) should be taken according to package directions if there are no bowel movements after 48 hours.   5. Wash /   shower every day.  You may shower over the dressings as they are waterproof.  Continue to shower over incision(s) after the dressing is off. 6. Remove your waterproof bandages 5 days after surgery.  You may leave the incision open to air.  You may have skin tapes (Steri Strips) covering the incision(s).  Leave them on until one week, then  remove.  You may replace a dressing/Band-Aid to cover the incision for comfort if you wish.      7. ACTIVITIES as tolerated:   a. You may resume regular (light) daily activities beginning the next day--such as daily self-care, walking, climbing stairs--gradually increasing activities as tolerated.  If you can walk 30 minutes without difficulty, it is safe to try more intense activity such as jogging, treadmill, bicycling, low-impact aerobics, swimming, etc. b. Save the most intensive and strenuous activity for last such as sit-ups, heavy lifting, contact sports, etc  Refrain from any heavy lifting or straining until you are off narcotics for pain control.   c. DO NOT PUSH THROUGH PAIN.  Let pain be your guide: If it hurts to do something, don't do it.  Pain is your body warning you to avoid that activity for another week until the pain goes down. d. You may drive when you are no longer taking prescription pain medication, you can comfortably wear a seatbelt, and you can safely maneuver your car and apply brakes. e. You may have sexual intercourse when it is comfortable.  8. FOLLOW UP in our office a. Please call [facility] at [phone] to set up an appointment to see your surgeon in the office for a follow-up appointment approximately 2-3 weeks after your surgery. b. Make sure that you call for this appointment the day you arrive home to insure a convenient appointment time. 9. IF YOU HAVE DISABILITY OR FAMILY LEAVE FORMS, BRING THEM TO THE OFFICE FOR PROCESSING.  DO NOT GIVE THEM TO YOUR DOCTOR.   WHEN TO CALL US [phone]: 1. Poor pain control 2. Reactions / problems with new medications (rash/itching, nausea, etc)  3. Fever over 101.5 F (38.5 C) 4. Worsening swelling or bruising 5. Continued bleeding from incision. 6. Increased pain, redness, or drainage from the incision 7. Difficulty breathing / swallowing   The clinic staff is available to answer your questions during regular  business hours (8:30am-5pm).  Please don't hesitate to call and ask to speak to one of our nurses for clinical concerns.   If you have a medical emergency, go to the nearest emergency room or call 911.  A surgeon from [facility] is always on call at the hospitals   [facility] [address], [address], [place],   [address] ? MAIN: [phone] ? TOLL FREE: [phone] ?  FAX [phone] [email or web address]   Post Anesthesia Home Care Instructions  Activity: Get plenty of rest for the remainder of the day. A responsible individual must stay with you for 24 hours following the procedure.  For the next 24 hours, DO NOT: -Drive a car -Operate machinery -Drink alcoholic beverages -Take any medication unless instructed by your physician -Make any legal decisions or sign important papers.  Meals: Start with liquid foods such as gelatin or soup. Progress to regular foods as tolerated. Avoid greasy, spicy, heavy foods. If nausea and/or vomiting occur, drink only clear liquids until the nausea and/or vomiting subsides. Call your physician if vomiting continues.  Special Instructions/Symptoms: Your throat may feel dry or sore   from the anesthesia or the breathing tube placed in your throat during surgery. If this causes discomfort, gargle with warm salt water. The discomfort should disappear within 24 hours.  If you had a scopolamine patch placed behind your ear for the management of post- operative nausea and/or vomiting:  1. The medication in the patch is effective for 72 hours, after which it should be removed.  Wrap patch in a tissue and discard in the trash. Wash hands thoroughly with soap and water. 2. You may remove the patch earlier than 72 hours if you experience unpleasant side effects which may include dry mouth, dizziness or visual disturbances. 3. Avoid touching the patch. Wash your hands with soap and water after contact  with the patch.      

## 2019-01-06 NOTE — Transfer of Care (Signed)
Immediate Anesthesia Transfer of Care Note  Patient: Christopher Grimes  Procedure(s) Performed: EXCISION LEFT MEDIAL THIGH MASS (Left Thigh)  Patient Location: PACU  Anesthesia Type:General  Level of Consciousness: awake, alert  and oriented  Airway & Oxygen Therapy: Patient Spontanous Breathing and Patient connected to face mask oxygen  Post-op Assessment: Report given to RN and Post -op Vital signs reviewed and stable  Post vital signs: Reviewed and stable  Last Vitals:  Vitals Value Taken Time  BP    Temp    Pulse    Resp    SpO2      Last Pain:  Vitals:   01/06/19 0706  TempSrc: Temporal  PainSc: 0-No pain      Patients Stated Pain Goal: 0 ([id number])  Complications: No apparent anesthesia complications

## 2019-01-06 NOTE — Op Note (Signed)
Preoperative diagnosis: 7 cm intramuscular left vastus medialis lipoma  Postoperative diagnosis: Same  Procedure: Excision of left vastus medialis intramuscular lipoma  Surgeon: Erroll Luna, MD  Anesthesia: LMA with 0.25% local Marcaine plain  EBL: 10 cc  Specimen: 7 cm lipoma to pathology  Drains: None  IV fluids: Per anesthesia record  Indications for procedure: The patient is a 23 year old male who had a longstanding history of a slowly growing mass in his left thigh.  He underwent work-up in April 2020 by his primary care provider which included an MRI which showed a 7 cm mass within the muscle belly of the vastus medialis of his left thigh.  This felt to be a lipoma by MRI characteristics.  It was causing pain he desired removal.  We discussed the risks and benefits of surgery as well as potential observation.  His risk of bleeding, infection, nerve injury, muscle loss, loss of function, numbness, major vascular injury, and the need further treatments and/or procedures.  Discussed potential cardiovascular risk anesthesia risk and other potential complications of wound infections and bleeding from the operative site.  We discussed potential recurrence rates.  He agreed to proceed.  Description of procedure: The patient was met in the holding area.  The area was marked on his left thigh with the assistance of the patient.  Questions were answered.  He was then taken back to the operating.  He is placed supine upon the operating table.  After induction of general esthesia left thigh was prepped and draped in sterile fashion timeout was performed.  He received appropriate preoperative antibiotics.  The mass was palpable.  My mark was still present and I used that to guide my incision.  A 7 cm incision was made over the mass.  Dissection was carried down to the anterior thigh until we encountered the aponeurosis of the vastus medialis.  I opened this and the 7 cm intramuscular lipoma was  identified.  I carefully dissected it away from the muscle fibers of the vastus medialis.  I was careful to dissect away any structures associated with the muscle.  The neurovascular bundle was posterior to this.  After careful dissection under direct vision, I removed the lipoma.  There is no evidence of any bleeding noted.  The wound was inspected and found to be hemostatic.  I then closed the aponeurosis of the external oblique with 2-0 Vicryl.  The subcutaneous tissues were closed with 2-0 Vicryl and 4-0 Monocryl was then used to close the skin in a subcuticular fashion.  Dermabond applied.  All final counts were found to be correct.  Patient was awoke extubated taken to recovery in satisfactory condition.

## 2019-01-06 NOTE — Anesthesia Postprocedure Evaluation (Signed)
Anesthesia Post Note  Patient: Grover Huneke  Procedure(s) Performed: EXCISION LEFT MEDIAL THIGH MASS (Left Thigh)     Patient location during evaluation: PACU Anesthesia Type: General Level of consciousness: awake and alert Pain management: pain level controlled Vital Signs Assessment: post-procedure vital signs reviewed and stable Respiratory status: spontaneous breathing, nonlabored ventilation and respiratory function stable Cardiovascular status: blood pressure returned to baseline and stable Postop Assessment: no apparent nausea or vomiting Anesthetic complications: no    Last Vitals:  Vitals:   01/06/19 1000 01/06/19 1014  BP:  (P) 118/80  Pulse: 66 (P) 70  Resp: 15 (P) 16  Temp:  (P) 37 C  SpO2: 98% (P) 96%    Last Pain:  Vitals:   01/06/19 1014  TempSrc:   PainSc: (P) 2                  Lidia Collum

## 2019-01-06 NOTE — Anesthesia Procedure Notes (Signed)
Procedure Name: LMA Insertion Performed by: Demetric Parslow M, CRNA Pre-anesthesia Checklist: Patient identified, Emergency Drugs available, Suction available and Patient being monitored Patient Re-evaluated:Patient Re-evaluated prior to induction Oxygen Delivery Method: Circle system utilized Preoxygenation: Pre-oxygenation with 100% oxygen Induction Type: IV induction Ventilation: Mask ventilation without difficulty LMA: LMA inserted LMA Size: 4.0 Number of attempts: 1 Airway Equipment and Method: Bite block Placement Confirmation: positive ETCO2 Tube secured with: Tape Dental Injury: Teeth and Oropharynx as per pre-operative assessment        

## 2019-01-06 NOTE — Anesthesia Preprocedure Evaluation (Addendum)
Anesthesia Evaluation  Patient identified by MRN, date of birth, ID band Patient awake    Reviewed: Allergy & Precautions, NPO status , Patient's Chart, lab work & pertinent test results  History of Anesthesia Complications Negative for: history of anesthetic complications  Airway Mallampati: II  TM Distance: >3 FB Neck ROM: Full    Dental  (+) Teeth Intact   Pulmonary neg pulmonary ROS,    Pulmonary exam normal        Cardiovascular negative cardio ROS Normal cardiovascular exam     Neuro/Psych negative neurological ROS  negative psych ROS   GI/Hepatic negative GI ROS, Neg liver ROS,   Endo/Other  negative endocrine ROS  Renal/GU negative Renal ROS  negative genitourinary   Musculoskeletal negative musculoskeletal ROS (+)   Abdominal   Peds  Hematology negative hematology ROS (+)   Anesthesia Other Findings   Reproductive/Obstetrics                            Anesthesia Physical Anesthesia Plan  ASA: I  Anesthesia Plan: General   Post-op Pain Management:    Induction: Intravenous  PONV Risk Score and Plan: 2 and Ondansetron, Dexamethasone, Treatment may vary due to age or medical condition and Midazolam  Airway Management Planned: LMA  Additional Equipment: None  Intra-op Plan:   Post-operative Plan: Extubation in OR  Informed Consent: I have reviewed the patients History and Physical, chart, labs and discussed the procedure including the risks, benefits and alternatives for the proposed anesthesia with the patient or authorized representative who has indicated his/her understanding and acceptance.     Dental advisory given  Plan Discussed with:   Anesthesia Plan Comments:        Anesthesia Quick Evaluation

## 2019-01-06 NOTE — Interval H&P Note (Signed)
History and Physical Interval Note:  01/06/2019 8:10 AM  Christopher Grimes  has presented today for surgery, with the diagnosis of LIPOMA.  The various methods of treatment have been discussed with the patient and family. After consideration of risks, benefits and other options for treatment, the patient has consented to  Procedure(s): EXCISION LEFT MEDIAL THIGH MASS (Left) as a surgical intervention.  The patient's history has been reviewed, patient examined, no change in status, stable for surgery.  I have reviewed the patient's chart and labs.  Questions were answered to the patient's satisfaction.     Emlyn

## 2019-01-07 ENCOUNTER — Encounter: Payer: Self-pay | Admitting: *Deleted

## 2019-01-07 LAB — SURGICAL PATHOLOGY

## 2019-05-05 ENCOUNTER — Encounter (INDEPENDENT_AMBULATORY_CARE_PROVIDER_SITE_OTHER): Payer: BC Managed Care – PPO | Admitting: Osteopathic Medicine

## 2019-05-05 DIAGNOSIS — L989 Disorder of the skin and subcutaneous tissue, unspecified: Secondary | ICD-10-CM

## 2019-05-06 NOTE — Telephone Encounter (Signed)
5 mins spent  

## 2019-05-06 NOTE — Telephone Encounter (Signed)
Forwarding to provider for review. External Dermatology referral pended.

## 2019-12-22 IMAGING — US US EXTREM LOW VENOUS*L*
1 series · 13 of 24 positions shown · non-contrast
Comparison: 03/18/2018

CLINICAL DATA: Follow-up of recent thrombophlebitis of the left
great saphenous vein.



[Series 1: us extrem low venous*left* · 0.07mm/px · 13 of 32 slices shown]
[im 1/32]
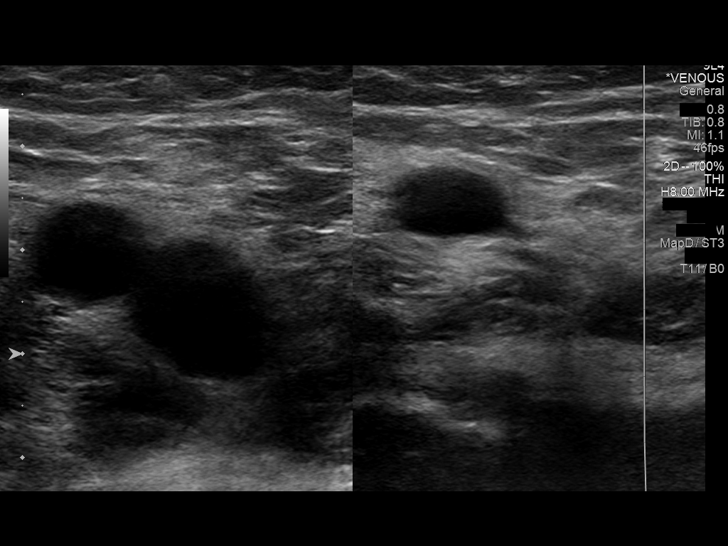
[im 3/32]
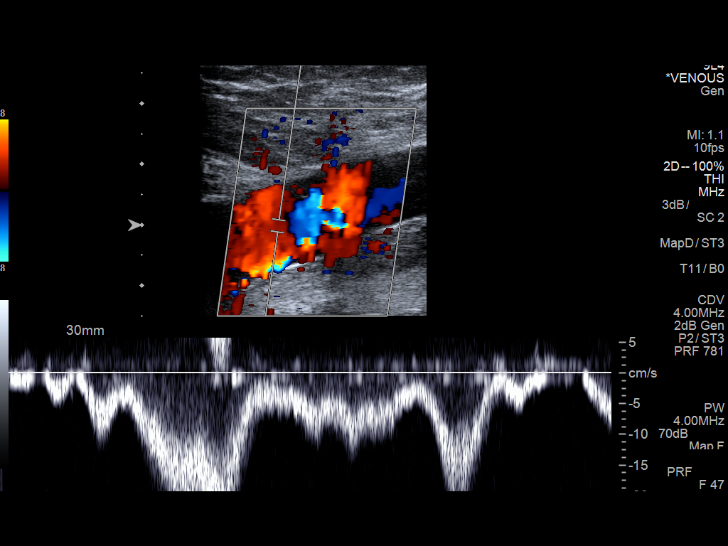
[im 6/32]
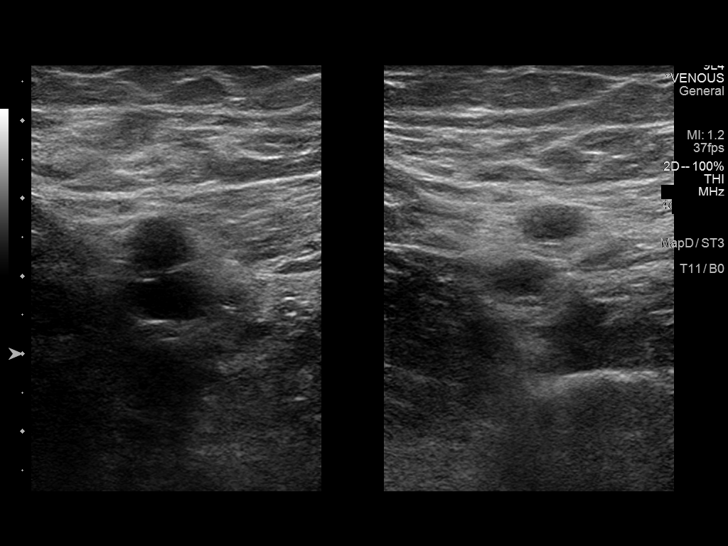
[im 9/32]
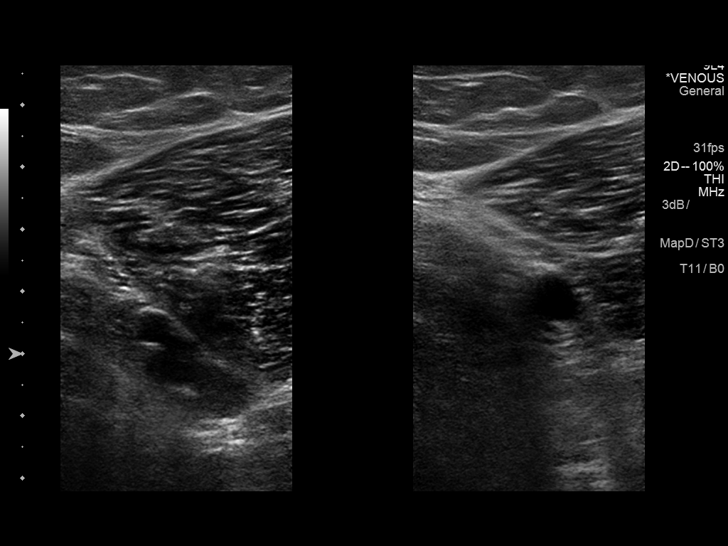
[im 11/32]
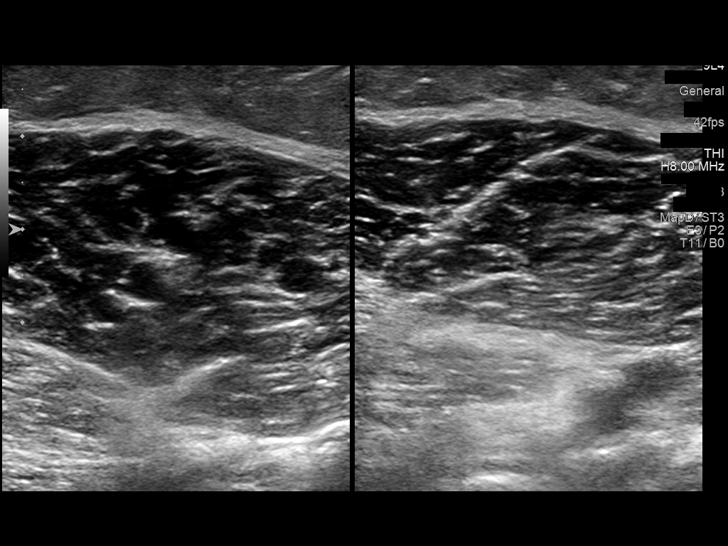
[im 14/32]
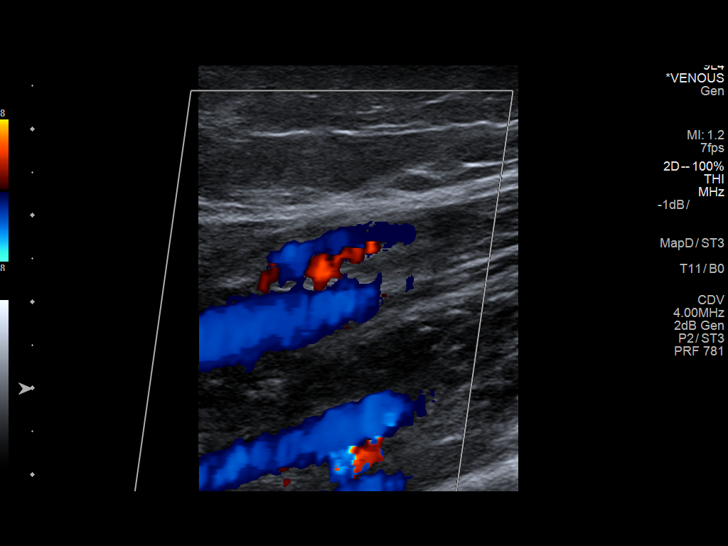
[im 17/32]
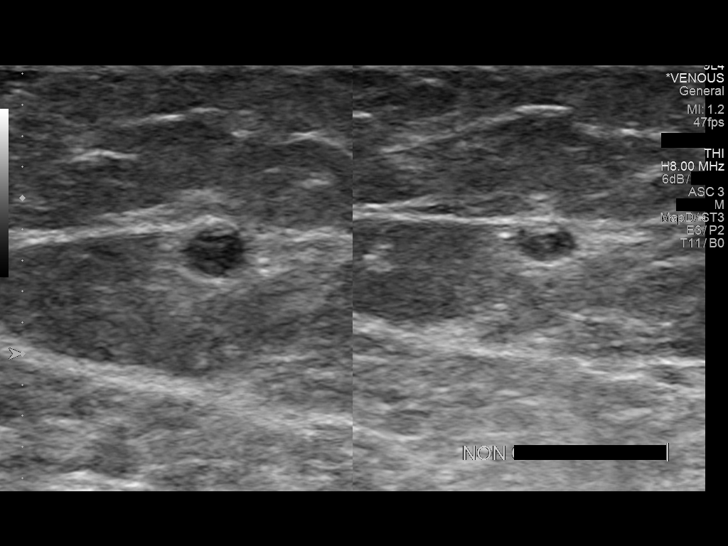
[im 18/32]
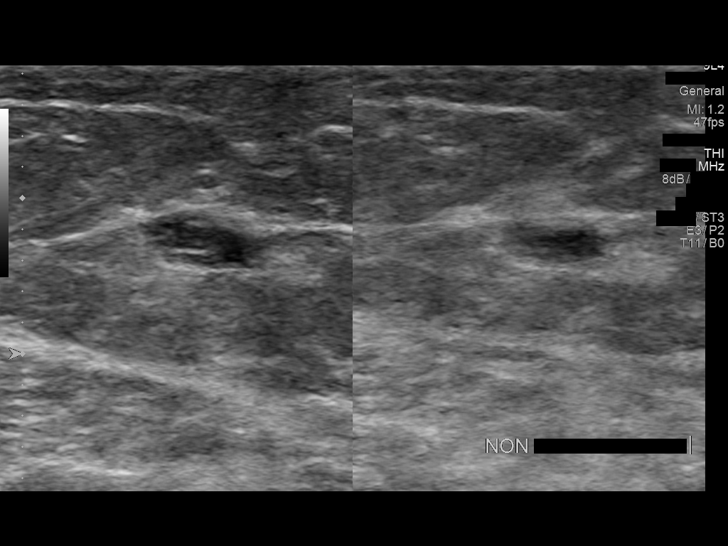
[im 21/32]
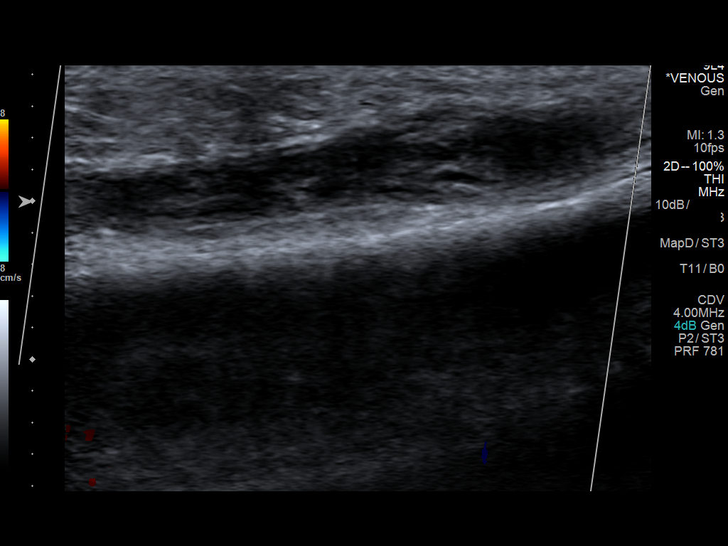
[im 23/32]
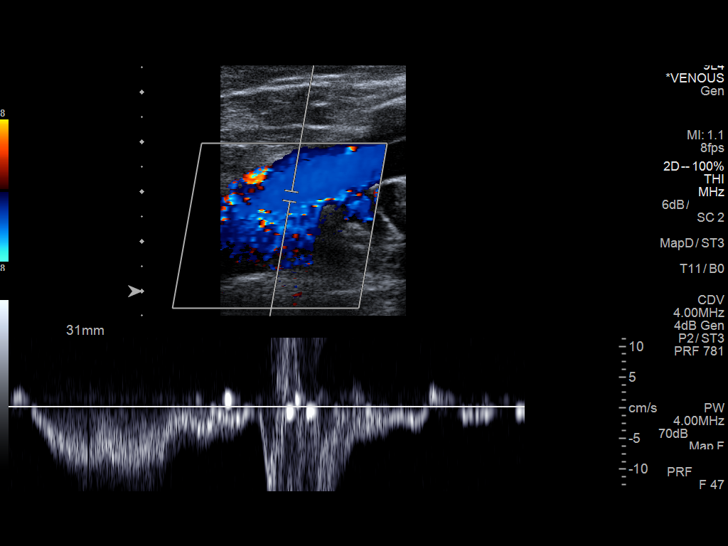
[im 26/32]
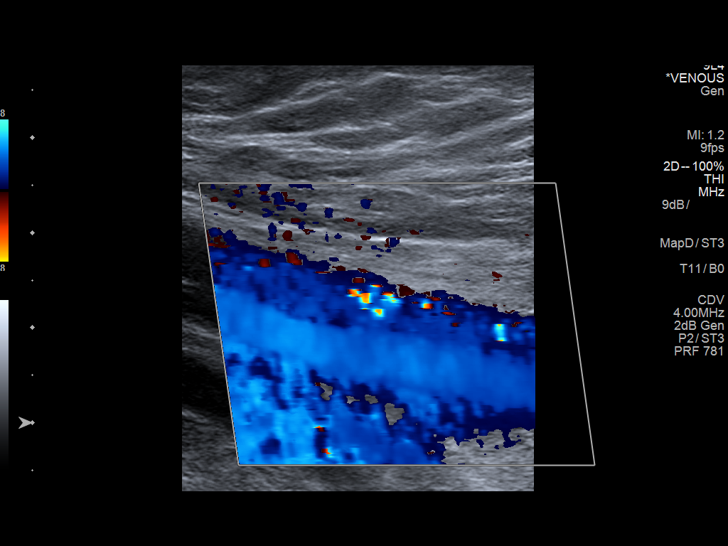
[im 29/32]
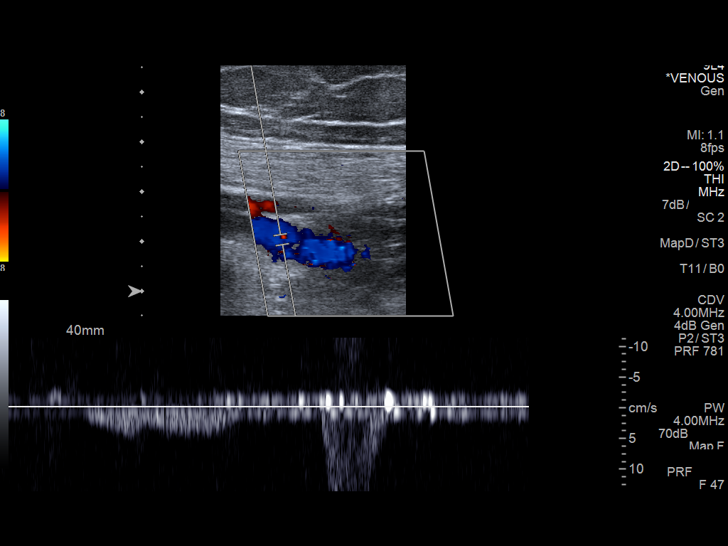
[im 32/32]
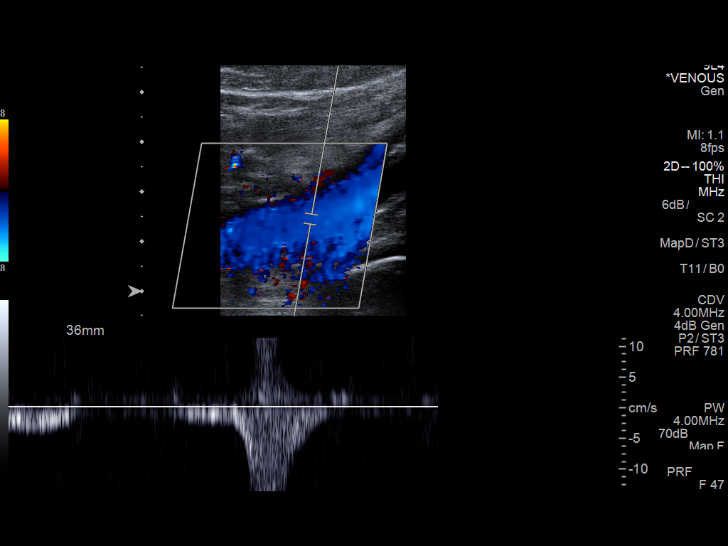

[13 of 24 positions shown; findings below may reference images not displayed]

FINDINGS: Contralateral Common Femoral Vein: Respiratory phasicity is normal
and symmetric with the symptomatic side. No evidence of thrombus.
Normal compressibility.

Common Femoral Vein: No evidence of thrombus. Normal
compressibility, respiratory phasicity and response to augmentation.

Saphenofemoral Junction: No evidence of thrombus. Normal
compressibility and flow on color Doppler imaging.

Profunda Femoral Vein: No evidence of thrombus. Normal
compressibility and flow on color Doppler imaging.

Femoral Vein: No evidence of thrombus. Normal compressibility,
respiratory phasicity and response to augmentation.

Popliteal Vein: No evidence of thrombus. Normal compressibility,
respiratory phasicity and response to augmentation.

Calf Veins: No evidence of thrombus. Normal compressibility and flow
on color Doppler imaging.

Superficial Great Saphenous Vein: There remains evidence of
superficial thrombophlebitis in the left great saphenous vein
extending from just above the knee to the ankle. Wall there may be
slight propagation of GSV thrombus to just above the knee, there
certainly is no evidence of thrombus further in the thigh or at the
saphenofemoral junction.

Venous Reflux:  None.

Other Findings:  No abnormal fluid collections.
IMPRESSION: Persistent left GSV superficial thrombophlebitis. It is likely too
early to detect any significant resolution. While there may be
slightly higher thrombus in the GSV to just above the knee, there
clearly is no evidence of deep venous thrombosis.

## 2019-12-28 IMAGING — US US EXTREM LOW VENOUS*L*
1 series · 13 of 24 positions shown · non-contrast
Comparison: None.

CLINICAL DATA: Left GSV thrombophlebitis



[Series 1: us extrem low venous*left* · 0.07mm/px · 13 of 31 slices shown]
[im 1/31]
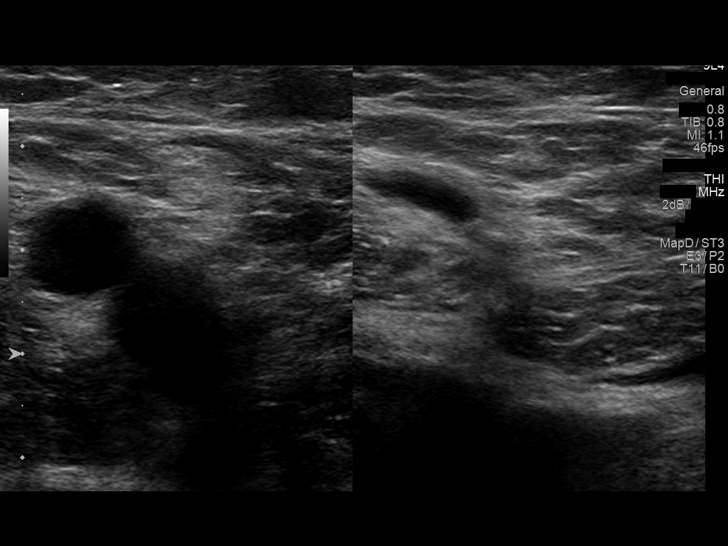
[im 3/31]
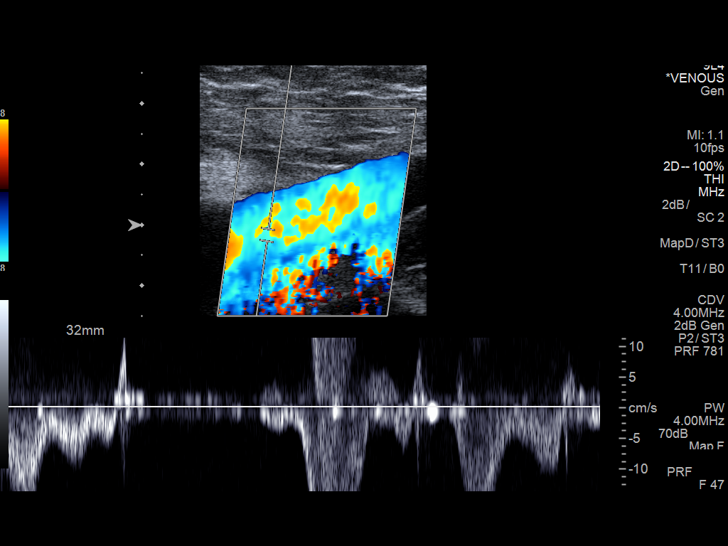
[im 6/31]
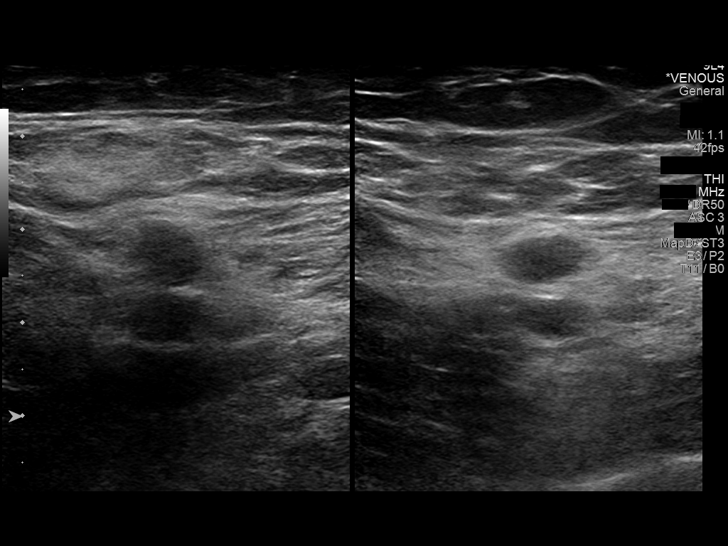
[im 8/31]
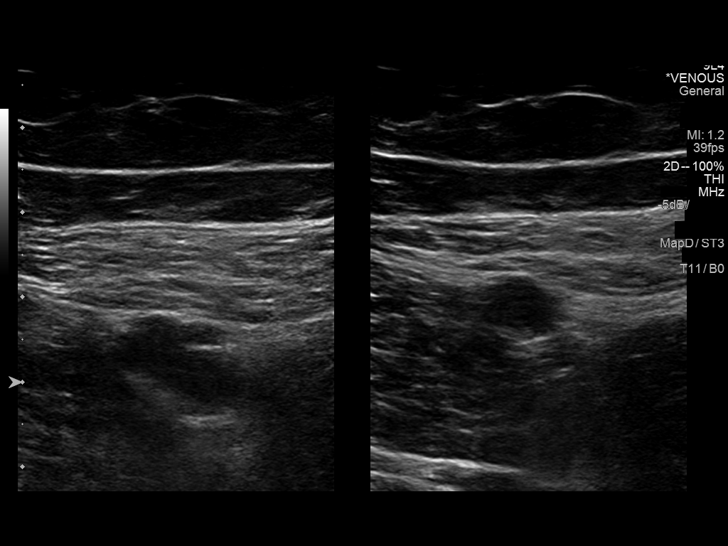
[im 11/31]
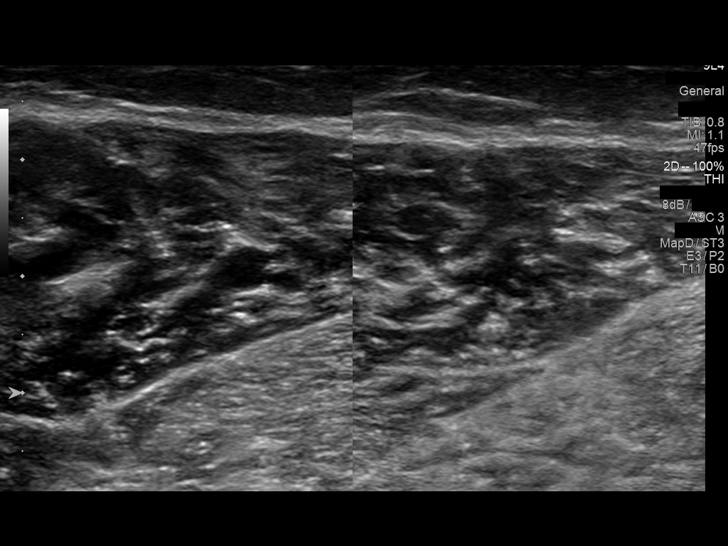
[im 14/31]
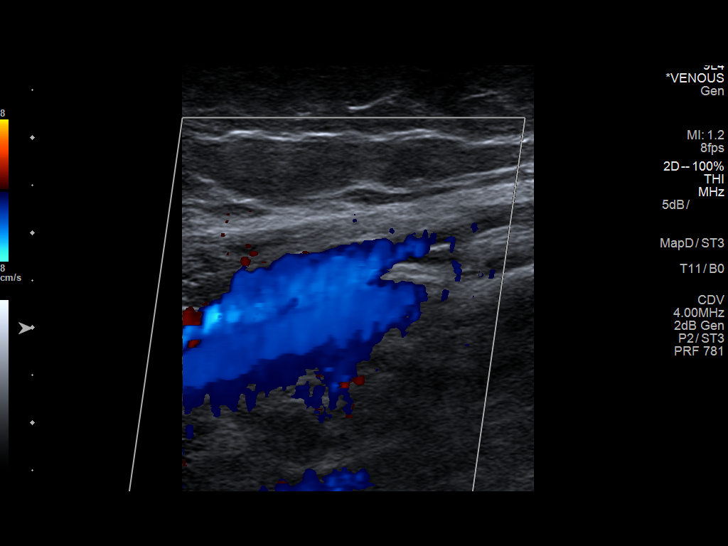
[im 16/31]
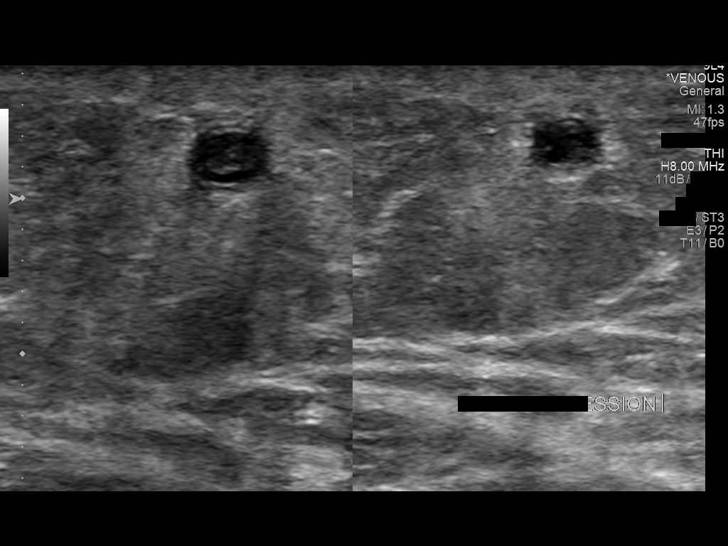
[im 17/31]
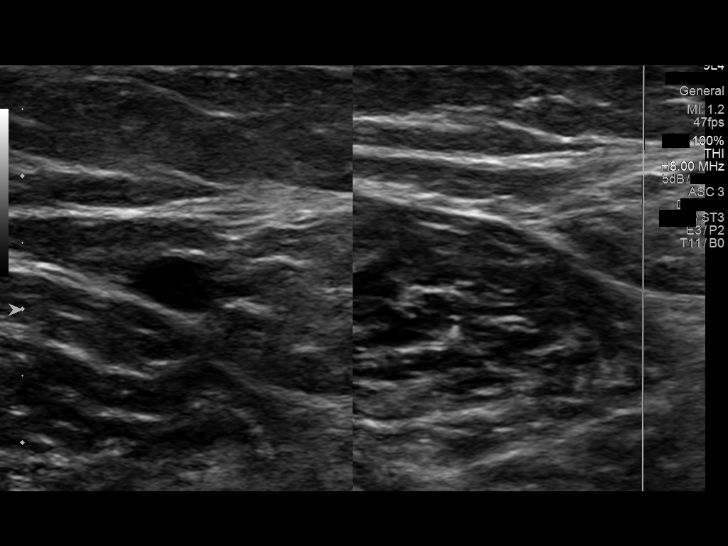
[im 20/31]
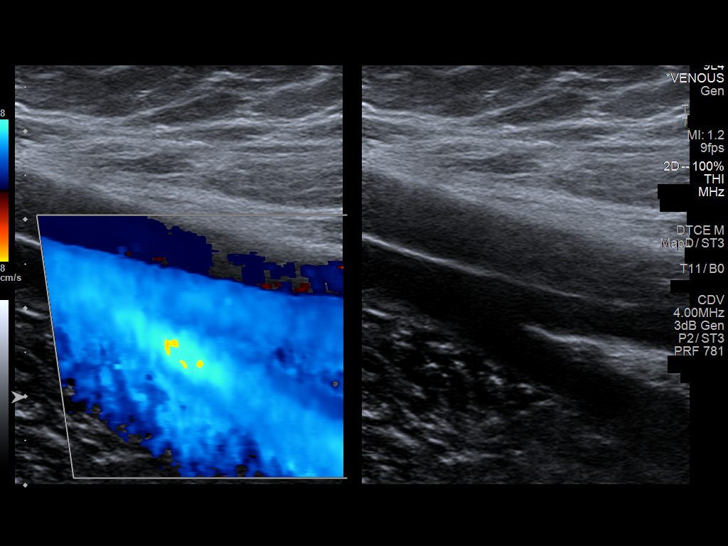
[im 23/31]
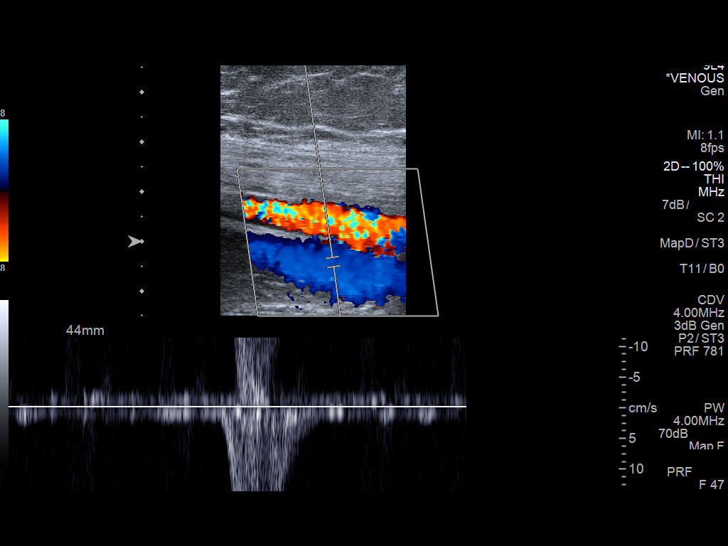
[im 25/31]
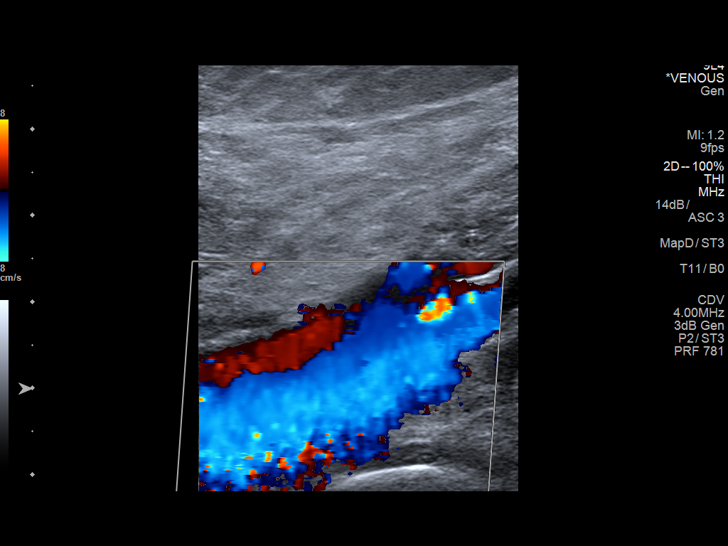
[im 28/31]
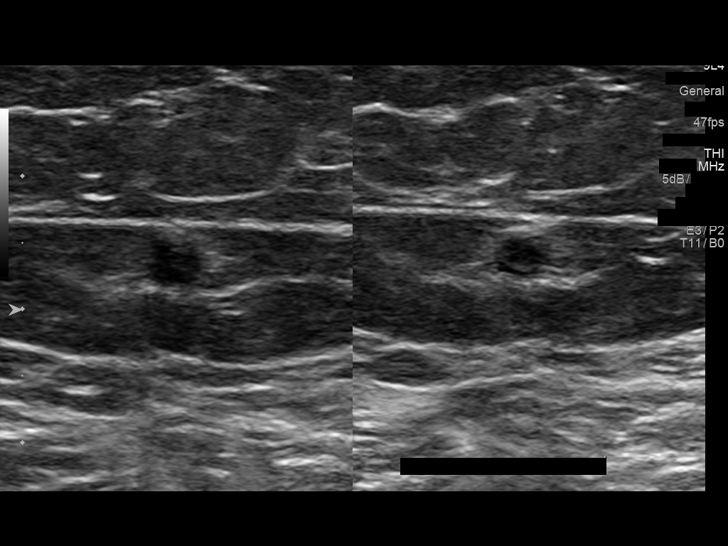
[im 31/31]
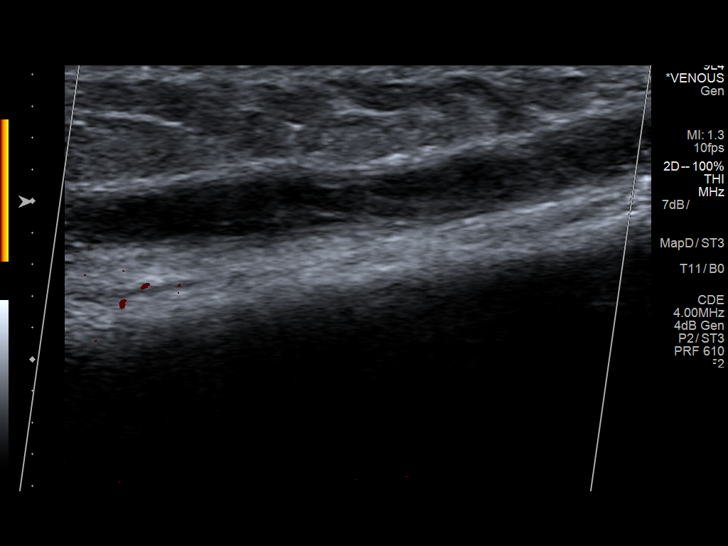

[13 of 24 positions shown; findings below may reference images not displayed]

FINDINGS: Contralateral Common Femoral Vein: Respiratory phasicity is normal
and symmetric with the symptomatic side. No evidence of thrombus.
Normal compressibility.

Common Femoral Vein: No evidence of thrombus. Normal
compressibility, respiratory phasicity and response to augmentation.

Saphenofemoral Junction: No evidence of thrombus. Normal
compressibility and flow on color Doppler imaging.

Profunda Femoral Vein: No evidence of thrombus. Normal
compressibility and flow on color Doppler imaging.

Femoral Vein: No evidence of thrombus. Normal compressibility,
respiratory phasicity and response to augmentation.

Popliteal Vein: No evidence of thrombus. Normal compressibility,
respiratory phasicity and response to augmentation.

Calf Veins: No evidence of thrombus. Normal compressibility and flow
on color Doppler imaging.

Superficial Great Saphenous Vein: Similar diffuse superficial
thrombosis of the left GSV from the mid thigh extending inferiorly
into the calf. Left GSV is noncompressible. GSV superficial
thrombosis is occlusive.

Venous Reflux:  Not assessed

Other Findings:  None.
IMPRESSION: Diffuse left GSV superficial thrombosis/thrombophlebitis, unchanged.

Negative for left lower extremity DVT.

## 2019-12-30 DIAGNOSIS — Z113 Encounter for screening for infections with a predominantly sexual mode of transmission: Secondary | ICD-10-CM | POA: Diagnosis not present

## 2020-03-20 ENCOUNTER — Emergency Department (INDEPENDENT_AMBULATORY_CARE_PROVIDER_SITE_OTHER)
Admission: EM | Admit: 2020-03-20 | Discharge: 2020-03-20 | Disposition: A | Discharge status: Home / Self Care | Payer: BC Managed Care – PPO | Source: Home / Self Care | Attending: Family Medicine | Admitting: Family Medicine

## 2020-03-20 ENCOUNTER — Other Ambulatory Visit: Payer: Self-pay

## 2020-03-20 DIAGNOSIS — Z202 Contact with and (suspected) exposure to infections with a predominantly sexual mode of transmission: Secondary | ICD-10-CM | POA: Diagnosis not present

## 2020-03-20 NOTE — ED Provider Notes (Signed)
[name]    CSN: [id number] Arrival date & time: 03/20/20  1041      History   Chief Complaint Chief Complaint  Patient presents with  . STD Testing    HPI Christopher Grimes is a 25 y.o. male.   Patient denies symptoms States wants STI testing " because  Have been more sexually active recently"  Past Medical History:  Diagnosis Date  . Family history of bleeding or clotting disorder 04/16/2018  . Mass of left thigh 04/16/2018  . Thrombophlebitis of superficial veins of left lower extremity 04/16/2018    Patient Active Problem List   Diagnosis Date Noted  . Thrombophlebitis of superficial veins of left lower extremity 04/16/2018  . Family history of bleeding or clotting disorder 04/16/2018  . Mass of left thigh 04/16/2018    Past Surgical History:  Procedure Laterality Date  . FOOT SURGERY    . GANGLION CYST EXCISION    . MASS EXCISION Left 01/06/2019   Procedure: EXCISION LEFT MEDIAL THIGH MASS;  Surgeon: Erroll Luna, MD;  Location: [facility];  Service: General;  Laterality: Left;  . TONSILLECTOMY         Home Medications    Prior to Admission medications   Medication Sig Start Date End Date Taking? Authorizing Provider  ibuprofen (ADVIL) 800 MG tablet Take 1 tablet (800 mg total) by mouth every 8 (eight) hours as needed. 01/06/19   Cornett, [name], MD  oxyCODONE (OXY IR/ROXICODONE) 5 MG immediate release tablet Take 1 tablet (5 mg total) by mouth every 6 (six) hours as needed for severe pain. 01/06/19   Erroll Luna, MD    Family History Family History  Problem Relation Age of Onset  . Factor V Leiden deficiency Father     Social History Social History   Tobacco Use  . Smoking status: Never Smoker  . Smokeless tobacco: Never Used  Vaping Use  . Vaping Use: Former  . Devices: Tried once or twice per patient  Substance Use Topics  . Alcohol use: Never  . Drug use: Never     Allergies   Patient has no known  allergies.   Review of Systems Review of Systems  SEE HPI Physical Exam Triage Vital Signs ED Triage Vitals  Enc Vitals Group     BP 03/20/20 1107 (!) 147/92     Pulse Rate 03/20/20 1107 69     Resp 03/20/20 1107 17     Temp 03/20/20 1107 98.4 F (36.9 C)     Temp Source 03/20/20 1107 Oral     SpO2 03/20/20 1107 100 %     Weight --      Height --      Head Circumference --      Peak Flow --      Pain Score 03/20/20 1109 0     Pain Loc --      Pain Edu? --      Excl. in [place]? --    No data found.  Updated Vital Signs BP (!) 147/92 (BP Location: Right Arm)   Pulse 69   Temp 98.4 F (36.9 C) (Oral)   Resp 17   SpO2 100%       Physical Exam Constitutional:      General: He is not in acute distress.    Appearance: He is well-developed and well-nourished.     Comments: Observation/discussion only  HENT:     Head: Normocephalic and atraumatic.     Mouth/Throat:  Mouth: Oropharynx is clear and moist.  Eyes:     Conjunctiva/sclera: Conjunctivae normal.     Pupils: Pupils are equal, round, and reactive to light.  Cardiovascular:     Rate and Rhythm: Normal rate.  Pulmonary:     Effort: Pulmonary effort is normal. No respiratory distress.  Abdominal:     General: There is no distension.     Palpations: Abdomen is soft.  Musculoskeletal:        General: No edema. Normal range of motion.     Cervical back: Normal range of motion.  Skin:    General: Skin is warm and dry.  Neurological:     Mental Status: He is alert.  Psychiatric:        Behavior: Behavior normal.      UC Treatments / Results  Labs (all labs ordered are listed, but only abnormal results are displayed) Labs Reviewed  C. TRACHOMATIS/N. GONORRHOEAE RNA  RPR  HIV ANTIBODY (ROUTINE TESTING W REFLEX)    EKG   Radiology No results found.  Procedures Procedures (including critical care time)  Medications Ordered in UC Medications - No data to display  Initial Impression /  Assessment and Plan / UC Course  I have reviewed the triage vital signs and the nursing notes.  Pertinent labs & imaging results that were available during my care of the patient were reviewed by me and considered in my medical decision making (see chart for details).     Discussed safe sex Final Clinical Impressions(s) / UC Diagnoses   Final diagnoses:  Possible exposure to STD     Discharge Instructions     Check for results on My Chart   ED Prescriptions    None     PDMP not reviewed this encounter.   Raylene Everts, MD 03/20/20 1213

## 2020-03-20 NOTE — ED Triage Notes (Signed)
Pt here today for STD testing. States hes not currently having any sxs and no known exposure. Just wants for peace of mind.

## 2020-03-20 NOTE — Discharge Instructions (Signed)
Check for results on My Chart

## 2020-03-21 LAB — C. TRACHOMATIS/N. GONORRHOEAE RNA
C. trachomatis RNA, TMA: NOT DETECTED
N. gonorrhoeae RNA, TMA: NOT DETECTED

## 2020-03-21 LAB — RPR: RPR Ser Ql: NONREACTIVE

## 2020-03-21 LAB — HIV ANTIBODY (ROUTINE TESTING W REFLEX): HIV 1&2 Ab, 4th Generation: NONREACTIVE

## 2021-02-05 DIAGNOSIS — Z113 Encounter for screening for infections with a predominantly sexual mode of transmission: Secondary | ICD-10-CM | POA: Diagnosis not present
